# Patient Record
Sex: Male | Born: 1950 | Race: White | Hispanic: No | Marital: Married | Smoking: Never smoker
Health system: Southern US, Community
[De-identification: ages and names within clinical notes are randomized; demographics above are authoritative.]

## PROBLEM LIST (undated history)

## (undated) DIAGNOSIS — M549 Dorsalgia, unspecified: Secondary | ICD-10-CM

## (undated) DIAGNOSIS — E119 Type 2 diabetes mellitus without complications: Secondary | ICD-10-CM

## (undated) DIAGNOSIS — M1990 Unspecified osteoarthritis, unspecified site: Secondary | ICD-10-CM

## (undated) DIAGNOSIS — I10 Essential (primary) hypertension: Secondary | ICD-10-CM

## (undated) DIAGNOSIS — M109 Gout, unspecified: Secondary | ICD-10-CM

## (undated) HISTORY — DX: Gout, unspecified: M10.9

## (undated) HISTORY — DX: Type 2 diabetes mellitus without complications: E11.9

## (undated) HISTORY — DX: Essential (primary) hypertension: I10

## (undated) HISTORY — DX: Dorsalgia, unspecified: M54.9

## (undated) HISTORY — PX: OTHER SURGICAL HISTORY: SHX169

## (undated) HISTORY — DX: Unspecified osteoarthritis, unspecified site: M19.90

---

## 2004-01-24 ENCOUNTER — Emergency Department (HOSPITAL_COMMUNITY): Admission: EM | Admit: 2004-01-24 | Discharge: 2004-01-24 | Payer: Self-pay | Admitting: Emergency Medicine

## 2005-10-04 ENCOUNTER — Ambulatory Visit (HOSPITAL_COMMUNITY): Admission: RE | Admit: 2005-10-04 | Discharge: 2005-10-04 | Payer: Self-pay | Admitting: Pulmonary Disease

## 2007-01-19 ENCOUNTER — Ambulatory Visit: Payer: Self-pay | Admitting: Orthopedic Surgery

## 2007-04-03 ENCOUNTER — Encounter (HOSPITAL_COMMUNITY): Admission: RE | Admit: 2007-04-03 | Discharge: 2007-05-03 | Payer: Self-pay | Admitting: Podiatry

## 2010-08-03 ENCOUNTER — Ambulatory Visit (HOSPITAL_COMMUNITY)
Admission: RE | Admit: 2010-08-03 | Discharge: 2010-08-03 | Payer: Self-pay | Source: Home / Self Care | Admitting: Pulmonary Disease

## 2010-10-05 ENCOUNTER — Ambulatory Visit (HOSPITAL_COMMUNITY)
Admission: RE | Admit: 2010-10-05 | Discharge: 2010-10-05 | Payer: Self-pay | Source: Home / Self Care | Attending: Pulmonary Disease | Admitting: Pulmonary Disease

## 2010-10-21 ENCOUNTER — Other Ambulatory Visit (HOSPITAL_COMMUNITY): Payer: Self-pay | Admitting: Pulmonary Disease

## 2010-10-21 DIAGNOSIS — M549 Dorsalgia, unspecified: Secondary | ICD-10-CM

## 2010-10-23 ENCOUNTER — Ambulatory Visit (HOSPITAL_COMMUNITY)
Admission: RE | Admit: 2010-10-23 | Discharge: 2010-10-23 | Disposition: A | Discharge status: Home / Self Care | Payer: 59 | Source: Ambulatory Visit | Attending: Pulmonary Disease | Admitting: Pulmonary Disease

## 2010-10-23 DIAGNOSIS — M549 Dorsalgia, unspecified: Secondary | ICD-10-CM

## 2017-02-22 DIAGNOSIS — M549 Dorsalgia, unspecified: Secondary | ICD-10-CM | POA: Diagnosis not present

## 2017-02-22 DIAGNOSIS — I10 Essential (primary) hypertension: Secondary | ICD-10-CM | POA: Diagnosis not present

## 2017-02-22 DIAGNOSIS — W57XXXA Bitten or stung by nonvenomous insect and other nonvenomous arthropods, initial encounter: Secondary | ICD-10-CM | POA: Diagnosis not present

## 2017-09-12 DIAGNOSIS — E669 Obesity, unspecified: Secondary | ICD-10-CM | POA: Diagnosis not present

## 2017-09-12 DIAGNOSIS — M549 Dorsalgia, unspecified: Secondary | ICD-10-CM | POA: Diagnosis not present

## 2017-09-12 DIAGNOSIS — I10 Essential (primary) hypertension: Secondary | ICD-10-CM | POA: Diagnosis not present

## 2017-09-12 DIAGNOSIS — E785 Hyperlipidemia, unspecified: Secondary | ICD-10-CM | POA: Diagnosis not present

## 2017-09-12 DIAGNOSIS — M109 Gout, unspecified: Secondary | ICD-10-CM | POA: Diagnosis not present

## 2017-09-12 DIAGNOSIS — R739 Hyperglycemia, unspecified: Secondary | ICD-10-CM | POA: Diagnosis not present

## 2017-09-12 DIAGNOSIS — M545 Low back pain: Secondary | ICD-10-CM | POA: Diagnosis not present

## 2017-09-12 DIAGNOSIS — Z125 Encounter for screening for malignant neoplasm of prostate: Secondary | ICD-10-CM | POA: Diagnosis not present

## 2017-10-03 ENCOUNTER — Ambulatory Visit: Payer: PPO | Admitting: Orthopedic Surgery

## 2017-10-03 ENCOUNTER — Encounter: Payer: Self-pay | Admitting: Orthopedic Surgery

## 2017-10-03 ENCOUNTER — Ambulatory Visit (INDEPENDENT_AMBULATORY_CARE_PROVIDER_SITE_OTHER): Payer: PPO

## 2017-10-03 VITALS — BP 191/106 | HR 75 | Ht 72.0 in | Wt 310.0 lb

## 2017-10-03 DIAGNOSIS — G8929 Other chronic pain: Secondary | ICD-10-CM

## 2017-10-03 DIAGNOSIS — M25561 Pain in right knee: Secondary | ICD-10-CM

## 2017-10-03 NOTE — Patient Instructions (Signed)
Continue your diet modifications  Take diclofenac 75 mg twice a day  Return in 3 weeks

## 2017-10-03 NOTE — Progress Notes (Signed)
NEW PATIENT OFFICE VISIT    Chief Complaint  Patient presents with  . Knee Pain    right     67 year old male comes in for evaluation of painful right knee  He was basically fine until around Christmas time and [facility] by the time he got home he had pain preventing him from ambulating well and he had to get his wife to help him out of the truck.  He called the primary care doctor put him on a short course of steroids and he got 90% better.  However, after 3 days pain came back and so he took another heavier dose of steroids which did give him enough pain relief that he can ambulate however, he now complains of a tightness in the right knee moderate to severe pain when he is not on prednisone the pain is dull constant and now present for about a month to 5 weeks  Denies any trauma    Review of Systems  Constitutional: Negative for chills and fever.  Skin: Negative.   Neurological: Negative for tingling and focal weakness.     Past Medical History:  Diagnosis Date  . Hypertension     Past Surgical History:  Procedure Laterality Date  . none      Family History  Problem Relation Age of Onset  . Healthy Mother   . Healthy Father    Social History   Tobacco Use  . Smoking status: Never Smoker  . Smokeless tobacco: Never Used  Substance Use Topics  . Alcohol use: No    Frequency: Never  . Drug use: No    Current Meds  Medication Sig  . amLODipine (NORVASC) 5 MG tablet   . atenolol (TENORMIN) 50 MG tablet   . diclofenac (VOLTAREN) 75 MG EC tablet   . lisinopril-hydrochlorothiazide (PRINZIDE,ZESTORETIC) 20-12.5 MG tablet     BP (!) 191/106   Pulse 75   Ht 6' (1.829 m)   Wt (!) 310 lb (140.6 kg)   BMI 42.04 kg/m   Physical Exam  Constitutional: He is oriented to person, place, and time. He appears well-developed and well-nourished.  Vital signs have been reviewed and are stable. Gen. appearance the patient is well-developed and well-nourished  with normal grooming and hygiene.   Musculoskeletal:  GAIT IS slightly abnormal  Neurological: He is alert and oriented to person, place, and time.  Skin: Skin is warm and dry. No erythema.  Psychiatric: He has a normal mood and affect.  Vitals reviewed.   Ortho Exam   Right knee:  Small joint effusion medial joint line tenderness knee flexion is limited to 120 degrees with the knee feels tight, ligaments are stable.  No strength deficit skin is intact pulses are good sensation is normal  Left knee:    No tenderness or swelling, range of motion ligaments are stable strength and muscle tone normal skin is intact without erythema no warmth distal pulses are intact sensation is normal    Encounter Diagnosis  Name Primary?  . Chronic pain of right knee Yes   X-ray shows symmetric joint space narrowing the questionable possible chondrocalcinosis in the lateral compartment   PLAN:   Aspirate and inject right knee  Procedure note injection and aspiration right knee joint  Verbal consent was obtained to aspirate and inject the right knee joint   Timeout was completed to confirm the site of aspiration and injection  An 18-gauge needle was used to aspirate the knee joint from  a suprapatellar lateral approach.  The medications used were 40 mg of Depo-Medrol and 1% lidocaine 3 cc  Anesthesia was provided by ethyl chloride and the skin was prepped with alcohol.  After cleaning the skin with alcohol an 18-gauge needle was used to aspirate the right knee joint.  We obtained 0  cc of fluid  We follow this by injection of 40 mg of Depo-Medrol and 3 cc 1% lidocaine.  There were no complications. A sterile bandage was applied.   Increase diclofenac to 75 mg twice a day  3 weeks return for reevaluation

## 2017-10-28 DIAGNOSIS — H524 Presbyopia: Secondary | ICD-10-CM | POA: Diagnosis not present

## 2017-10-31 ENCOUNTER — Ambulatory Visit: Payer: PPO | Admitting: Orthopedic Surgery

## 2017-10-31 VITALS — BP 150/90 | HR 63 | Ht 72.0 in | Wt 290.0 lb

## 2017-10-31 DIAGNOSIS — G8929 Other chronic pain: Secondary | ICD-10-CM

## 2017-10-31 DIAGNOSIS — M25561 Pain in right knee: Secondary | ICD-10-CM

## 2017-10-31 NOTE — Progress Notes (Signed)
Chief Complaint  Patient presents with  . Follow-up    Recheck on right knee    Seen follow-up visit status post aspiration injection right knee and hip increase diclofenac to twice a day patient says he is 95% better  His knee has no effusion full range of motion he has no limp when he walks see him on an as-needed basis

## 2017-12-12 DIAGNOSIS — E669 Obesity, unspecified: Secondary | ICD-10-CM | POA: Diagnosis not present

## 2017-12-12 DIAGNOSIS — E119 Type 2 diabetes mellitus without complications: Secondary | ICD-10-CM | POA: Diagnosis not present

## 2017-12-12 DIAGNOSIS — M549 Dorsalgia, unspecified: Secondary | ICD-10-CM | POA: Diagnosis not present

## 2017-12-12 DIAGNOSIS — I10 Essential (primary) hypertension: Secondary | ICD-10-CM | POA: Diagnosis not present

## 2018-06-28 DIAGNOSIS — Z23 Encounter for immunization: Secondary | ICD-10-CM | POA: Diagnosis not present

## 2018-06-30 DIAGNOSIS — H903 Sensorineural hearing loss, bilateral: Secondary | ICD-10-CM | POA: Diagnosis not present

## 2018-07-05 DIAGNOSIS — H903 Sensorineural hearing loss, bilateral: Secondary | ICD-10-CM | POA: Diagnosis not present

## 2018-07-05 DIAGNOSIS — H9313 Tinnitus, bilateral: Secondary | ICD-10-CM | POA: Diagnosis not present

## 2018-07-05 DIAGNOSIS — J3089 Other allergic rhinitis: Secondary | ICD-10-CM | POA: Diagnosis not present

## 2018-07-14 DIAGNOSIS — H903 Sensorineural hearing loss, bilateral: Secondary | ICD-10-CM | POA: Diagnosis not present

## 2019-02-08 DIAGNOSIS — E119 Type 2 diabetes mellitus without complications: Secondary | ICD-10-CM | POA: Diagnosis not present

## 2019-02-08 DIAGNOSIS — R05 Cough: Secondary | ICD-10-CM | POA: Diagnosis not present

## 2019-02-08 DIAGNOSIS — I10 Essential (primary) hypertension: Secondary | ICD-10-CM | POA: Diagnosis not present

## 2019-03-29 DIAGNOSIS — E669 Obesity, unspecified: Secondary | ICD-10-CM | POA: Diagnosis not present

## 2019-03-29 DIAGNOSIS — I10 Essential (primary) hypertension: Secondary | ICD-10-CM | POA: Diagnosis not present

## 2019-03-29 DIAGNOSIS — M1990 Unspecified osteoarthritis, unspecified site: Secondary | ICD-10-CM | POA: Diagnosis not present

## 2019-04-05 ENCOUNTER — Other Ambulatory Visit: Payer: Self-pay

## 2019-04-06 DIAGNOSIS — E119 Type 2 diabetes mellitus without complications: Secondary | ICD-10-CM | POA: Diagnosis not present

## 2019-04-06 DIAGNOSIS — I10 Essential (primary) hypertension: Secondary | ICD-10-CM | POA: Diagnosis not present

## 2019-04-06 DIAGNOSIS — M1990 Unspecified osteoarthritis, unspecified site: Secondary | ICD-10-CM | POA: Diagnosis not present

## 2019-04-06 DIAGNOSIS — Z125 Encounter for screening for malignant neoplasm of prostate: Secondary | ICD-10-CM | POA: Diagnosis not present

## 2019-04-06 DIAGNOSIS — E669 Obesity, unspecified: Secondary | ICD-10-CM | POA: Diagnosis not present

## 2019-04-06 LAB — BASIC METABOLIC PANEL
BUN: 23 — AB (ref 4–21)
CO2: 23 — AB (ref 13–22)
Chloride: 106 (ref 99–108)
Creatinine: 1.2 (ref <=1.3)
Glucose: 128
Potassium: 4 (ref 3.4–5.3)
Sodium: 141 (ref 137–147)

## 2019-04-06 LAB — LIPID PANEL
Cholesterol: 237 — AB (ref 0–200)
HDL: 41 (ref 35–70)
LDL Cholesterol: 161
Triglycerides: 193 — AB (ref 40–160)

## 2019-04-06 LAB — TSH: TSH: 1.75 (ref <=5.90)

## 2019-04-06 LAB — COMPREHENSIVE METABOLIC PANEL
Albumin: 4.3 (ref 3.5–5.0)
Calcium: 9.4 (ref 8.7–10.7)
GFR calc Af Amer: 75
GFR calc non Af Amer: 65
Globulin: 2.3

## 2019-04-06 LAB — CBC AND DIFFERENTIAL
HCT: 47 (ref 41–53)
Hemoglobin: 15.7 (ref 13.5–17.5)
Neutrophils Absolute: 3562
Platelets: 124 — AB (ref 150–399)
WBC: 6.1

## 2019-04-06 LAB — CBC: RBC: 5.38 — AB (ref 3.87–5.11)

## 2019-04-06 LAB — PSA: PSA: 0.4

## 2019-04-06 LAB — HEMOGLOBIN A1C: Hemoglobin A1C: 6.2

## 2019-04-07 LAB — COLOGUARD: Cologuard: NEGATIVE

## 2019-04-10 DIAGNOSIS — Z1211 Encounter for screening for malignant neoplasm of colon: Secondary | ICD-10-CM | POA: Diagnosis not present

## 2019-04-10 DIAGNOSIS — Z1212 Encounter for screening for malignant neoplasm of rectum: Secondary | ICD-10-CM | POA: Diagnosis not present

## 2019-07-11 DIAGNOSIS — E119 Type 2 diabetes mellitus without complications: Secondary | ICD-10-CM | POA: Diagnosis not present

## 2019-07-11 DIAGNOSIS — Z23 Encounter for immunization: Secondary | ICD-10-CM | POA: Diagnosis not present

## 2019-07-11 DIAGNOSIS — I10 Essential (primary) hypertension: Secondary | ICD-10-CM | POA: Diagnosis not present

## 2019-07-11 DIAGNOSIS — E785 Hyperlipidemia, unspecified: Secondary | ICD-10-CM | POA: Diagnosis not present

## 2019-07-11 DIAGNOSIS — M1990 Unspecified osteoarthritis, unspecified site: Secondary | ICD-10-CM | POA: Diagnosis not present

## 2019-07-28 DIAGNOSIS — E119 Type 2 diabetes mellitus without complications: Secondary | ICD-10-CM | POA: Insufficient documentation

## 2019-07-28 DIAGNOSIS — M1990 Unspecified osteoarthritis, unspecified site: Secondary | ICD-10-CM | POA: Insufficient documentation

## 2019-07-28 DIAGNOSIS — I10 Essential (primary) hypertension: Secondary | ICD-10-CM | POA: Insufficient documentation

## 2019-07-28 DIAGNOSIS — M549 Dorsalgia, unspecified: Secondary | ICD-10-CM | POA: Insufficient documentation

## 2019-07-28 DIAGNOSIS — M109 Gout, unspecified: Secondary | ICD-10-CM

## 2019-09-24 ENCOUNTER — Other Ambulatory Visit: Payer: Self-pay

## 2019-09-24 ENCOUNTER — Ambulatory Visit (INDEPENDENT_AMBULATORY_CARE_PROVIDER_SITE_OTHER): Payer: PPO | Admitting: Family Medicine

## 2019-09-24 ENCOUNTER — Encounter: Payer: Self-pay | Admitting: Family Medicine

## 2019-09-24 VITALS — BP 130/99 | HR 62 | Temp 98.6°F | Ht 71.0 in | Wt 300.6 lb

## 2019-09-24 DIAGNOSIS — E119 Type 2 diabetes mellitus without complications: Secondary | ICD-10-CM

## 2019-09-24 DIAGNOSIS — M109 Gout, unspecified: Secondary | ICD-10-CM

## 2019-09-24 DIAGNOSIS — D696 Thrombocytopenia, unspecified: Secondary | ICD-10-CM

## 2019-09-24 DIAGNOSIS — E785 Hyperlipidemia, unspecified: Secondary | ICD-10-CM

## 2019-09-24 DIAGNOSIS — I10 Essential (primary) hypertension: Secondary | ICD-10-CM | POA: Diagnosis not present

## 2019-09-24 NOTE — Patient Instructions (Addendum)
Pt to make diabetic eye exam Fasting labwork after change in cholesterol medication  Diabetes Mellitus and Nutrition, Adult When you have diabetes (diabetes mellitus), it is very important to have healthy eating habits because your blood sugar (glucose) levels are greatly affected by what you eat and drink. Eating healthy foods in the appropriate amounts, at about the same times every day, can help you:  Control your blood glucose.  Lower your risk of heart disease.  Improve your blood pressure.  Reach or maintain a healthy weight. Every person with diabetes is different, and each person has different needs for a meal plan. Your health care provider may recommend that you work with a diet and nutrition specialist (dietitian) to make a meal plan that is best for you. Your meal plan may vary depending on factors such as:  The calories you need.  The medicines you take.  Your weight.  Your blood glucose, blood pressure, and cholesterol levels.  Your activity level.  Other health conditions you have, such as heart or kidney disease. How do carbohydrates affect me? Carbohydrates, also called carbs, affect your blood glucose level more than any other type of food. Eating carbs naturally raises the amount of glucose in your blood. Carb counting is a method for keeping track of how many carbs you eat. Counting carbs is important to keep your blood glucose at a healthy level, especially if you use insulin or take certain oral diabetes medicines. It is important to know how many carbs you can safely have in each meal. This is different for every person. Your dietitian can help you calculate how many carbs you should have at each meal and for each snack. Foods that contain carbs include:  Bread, cereal, rice, pasta, and crackers.  Potatoes and corn.  Peas, beans, and lentils.  Milk and yogurt.  Fruit and juice.  Desserts, such as cakes, cookies, ice cream, and candy. How does alcohol  affect me? Alcohol can cause a sudden decrease in blood glucose (hypoglycemia), especially if you use insulin or take certain oral diabetes medicines. Hypoglycemia can be a life-threatening condition. Symptoms of hypoglycemia (sleepiness, dizziness, and confusion) are similar to symptoms of having too much alcohol. If your health care provider says that alcohol is safe for you, follow these guidelines:  Limit alcohol intake to no more than 1 drink per day for nonpregnant women and 2 drinks per day for men. One drink equals 12 oz of beer, 5 oz of wine, or 1 oz of hard liquor.  Do not drink on an empty stomach.  Keep yourself hydrated with water, diet soda, or unsweetened iced tea.  Keep in mind that regular soda, juice, and other mixers may contain a lot of sugar and must be counted as carbs. What are tips for following this plan?  Reading food labels  Start by checking the serving size on the "Nutrition Facts" label of packaged foods and drinks. The amount of calories, carbs, fats, and other nutrients listed on the label is based on one serving of the item. Many items contain more than one serving per package.  Check the total grams (g) of carbs in one serving. You can calculate the number of servings of carbs in one serving by dividing the total carbs by 15. For example, if a food has 30 g of total carbs, it would be equal to 2 servings of carbs.  Check the number of grams (g) of saturated and trans fats in one serving. Choose foods  that have low or no amount of these fats.  Check the number of milligrams (mg) of salt (sodium) in one serving. Most people should limit total sodium intake to less than 2,300 mg per day.  Always check the nutrition information of foods labeled as "low-fat" or "nonfat". These foods may be higher in added sugar or refined carbs and should be avoided.  Talk to your dietitian to identify your daily goals for nutrients listed on the label. Shopping  Avoid buying  canned, premade, or processed foods. These foods tend to be high in fat, sodium, and added sugar.  Shop around the outside edge of the grocery store. This includes fresh fruits and vegetables, bulk grains, fresh meats, and fresh dairy. Cooking  Use low-heat cooking methods, such as baking, instead of high-heat cooking methods like deep frying.  Cook using healthy oils, such as olive, canola, or sunflower oil.  Avoid cooking with butter, cream, or high-fat meats. Meal planning  Eat meals and snacks regularly, preferably at the same times every day. Avoid going long periods of time without eating.  Eat foods high in fiber, such as fresh fruits, vegetables, beans, and whole grains. Talk to your dietitian about how many servings of carbs you can eat at each meal.  Eat 4-6 ounces (oz) of lean protein each day, such as lean meat, chicken, fish, eggs, or tofu. One oz of lean protein is equal to: ? 1 oz of meat, chicken, or fish. ? 1 egg. ?  cup of tofu.  Eat some foods each day that contain healthy fats, such as avocado, nuts, seeds, and fish. Lifestyle  Check your blood glucose regularly.  Exercise regularly as told by your health care provider. This may include: ? 150 minutes of moderate-intensity or vigorous-intensity exercise each week. This could be brisk walking, biking, or water aerobics. ? Stretching and doing strength exercises, such as yoga or weightlifting, at least 2 times a week.  Take medicines as told by your health care provider.  Do not use any products that contain nicotine or tobacco, such as cigarettes and e-cigarettes. If you need help quitting, ask your health care provider.  Work with a Veterinary surgeon or diabetes educator to identify strategies to manage stress and any emotional and social challenges. Questions to ask a health care provider  Do I need to meet with a diabetes educator?  Do I need to meet with a dietitian?  What number can I call if I have  questions?  When are the best times to check my blood glucose? Where to find more information:  American Diabetes Association: diabetes.org  Academy of Nutrition and Dietetics: www.eatright.[employer] of Diabetes and Digestive and Kidney Diseases (NIH): CarFlippers.tn Summary  A healthy meal plan will help you control your blood glucose and maintain a healthy lifestyle.  Working with a diet and nutrition specialist (dietitian) can help you make a meal plan that is best for you.  Keep in mind that carbohydrates (carbs) and alcohol have immediate effects on your blood glucose levels. It is important to count carbs and to use alcohol carefully. This information is not intended to replace advice given to you by your health care provider. Make sure you discuss any questions you have with your health care provider. Document Revised: 08/05/2017 Document Reviewed: 09/27/2016 Elsevier Patient Education  2020 Elsevier Inc.  Diabetes Mellitus and Foot Care Foot care is an important part of your health, especially when you have diabetes. Diabetes may cause you to  have problems because of poor blood flow (circulation) to your feet and legs, which can cause your skin to:  Become thinner and drier.  Break more easily.  Heal more slowly.  Peel and crack. You may also have nerve damage (neuropathy) in your legs and feet, causing decreased feeling in them. This means that you may not notice minor injuries to your feet that could lead to more serious problems. Noticing and addressing any potential problems early is the best way to prevent future foot problems. How to care for your feet Foot hygiene  Wash your feet daily with warm water and mild soap. Do not use hot water. Then, pat your feet and the areas between your toes until they are completely dry. Do not soak your feet as this can dry your skin.  Trim your toenails straight across. Do not dig under them or around the cuticle.  File the edges of your nails with an emery board or nail file.  Apply a moisturizing lotion or petroleum jelly to the skin on your feet and to dry, brittle toenails. Use lotion that does not contain alcohol and is unscented. Do not apply lotion between your toes. Shoes and socks  Wear clean socks or stockings every day. Make sure they are not too tight. Do not wear knee-high stockings since they may decrease blood flow to your legs.  Wear shoes that fit properly and have enough cushioning. Always look in your shoes before you put them on to be sure there are no objects inside.  To break in new shoes, wear them for just a few hours a day. This prevents injuries on your feet. Wounds, scrapes, corns, and calluses  Check your feet daily for blisters, cuts, bruises, sores, and redness. If you cannot see the bottom of your feet, use a mirror or ask someone for help.  Do not cut corns or calluses or try to remove them with medicine.  If you find a minor scrape, cut, or break in the skin on your feet, keep it and the skin around it clean and dry. You may clean these areas with mild soap and water. Do not clean the area with peroxide, alcohol, or iodine.  If you have a wound, scrape, corn, or callus on your foot, look at it several times a day to make sure it is healing and not infected. Check for: ? Redness, swelling, or pain. ? Fluid or blood. ? Warmth. ? Pus or a bad smell. General instructions  Do not cross your legs. This may decrease blood flow to your feet.  Do not use heating pads or hot water bottles on your feet. They may burn your skin. If you have lost feeling in your feet or legs, you may not know this is happening until it is too late.  Protect your feet from hot and cold by wearing shoes, such as at the beach or on hot pavement.  Schedule a complete foot exam at least once a year (annually) or more often if you have foot problems. If you have foot problems, report any cuts,  sores, or bruises to your health care provider immediately. Contact a health care provider if:  You have a medical condition that increases your risk of infection and you have any cuts, sores, or bruises on your feet.  You have an injury that is not healing.  You have redness on your legs or feet.  You feel burning or tingling in your legs or feet.  You  have pain or cramps in your legs and feet.  Your legs or feet are numb.  Your feet always feel cold.  You have pain around a toenail. Get help right away if:  You have a wound, scrape, corn, or callus on your foot and: ? You have pain, swelling, or redness that gets worse. ? You have fluid or blood coming from the wound, scrape, corn, or callus. ? Your wound, scrape, corn, or callus feels warm to the touch. ? You have pus or a bad smell coming from the wound, scrape, corn, or callus. ? You have a fever. ? You have a red line going up your leg. Summary  Check your feet every day for cuts, sores, red spots, swelling, and blisters.  Moisturize feet and legs daily.  Wear shoes that fit properly and have enough cushioning.  If you have foot problems, report any cuts, sores, or bruises to your health care provider immediately.  Schedule a complete foot exam at least once a year (annually) or more often if you have foot problems. This information is not intended to replace advice given to you by your health care provider. Make sure you discuss any questions you have with your health care provider. Document Revised: 05/16/2019 Document Reviewed: 09/24/2016 Elsevier Patient Education  2020 [employer].

## 2019-09-24 NOTE — Progress Notes (Signed)
New Patient Office Visit  Subjective:  Patient ID: Phillip Hayes, male    DOB: 1951-04-28  Age: 69 y.o. MRN: [id number]  CC:  Chief Complaint  Patient presents with  . Establish Care  HTN/Hyperlipidemia  HPI WIL SLAPE presents for DM-type2 Hyperlipidemia-Atenolol HTN-Atenolol/Amlodipine/Olmesartan/HCTZ DM-type 2 Gout-no recent flare-took prednisone in the past Past Medical History:  Diagnosis Date  . Dorsalgia, unspecified   . Essential (primary) hypertension   . Gout, unspecified   . Hypertension   . Type 2 diabetes mellitus without complications (HCC)   . Unspecified osteoarthritis, unspecified site     Past Surgical History:  Procedure Laterality Date  . none      Family History  Problem Relation Age of Onset  . Healthy Mother   . Heart disease Mother   . Stroke Mother   . Healthy Father     Social History   Socioeconomic History  . Marital status: Married    Spouse name: Not on file  . Number of children: Not on file  . Years of education: Not on file  . Highest education level: Not on file  Occupational History  . Not on file  Tobacco Use  . Smoking status: Never Smoker  . Smokeless tobacco: Never Used  Substance and Sexual Activity  . Alcohol use: No  . Drug use: No  . Sexual activity: Not on file  Other Topics Concern  . Not on file  Social History Narrative  . Not on file   Social Determinants of Health   Financial Resource Strain:   . Difficulty of Paying Living Expenses: Not on file  Food Insecurity:   . Worried About Programme researcher, broadcasting/film/video in the Last Year: Not on file  . Ran Out of Food in the Last Year: Not on file  Transportation Needs:   . Lack of Transportation (Medical): Not on file  . Lack of Transportation (Non-Medical): Not on file  Physical Activity:   . Days of Exercise per Week: Not on file  . Minutes of Exercise per Session: Not on file  Stress:   . Feeling of Stress : Not on file  Social Connections:   .  Frequency of Communication with Friends and Family: Not on file  . Frequency of Social Gatherings with Friends and Family: Not on file  . Attends Religious Services: Not on file  . Active Member of Clubs or Organizations: Not on file  . Attends Banker Meetings: Not on file  . Marital Status: Not on file  Intimate Partner Violence:   . Fear of Current or Ex-Partner: Not on file  . Emotionally Abused: Not on file  . Physically Abused: Not on file  . Sexually Abused: Not on file    ROS Review of Systems  Constitutional: Negative.   HENT: Positive for tinnitus.   Eyes: Negative.   Respiratory: Negative.   Cardiovascular: Negative.   Gastrointestinal: Negative.   Endocrine: Negative.   Genitourinary: Negative.   Musculoskeletal: Negative.   Allergic/Immunologic: Negative.   Neurological: Negative.   Hematological: Negative.   Psychiatric/Behavioral: Negative.     Objective:   Today's Vitals: BP (!) 130/99 (BP Location: Left Arm, Patient Position: Sitting, Cuff Size: Normal)   Pulse 62   Temp 98.6 F (37 C) (Temporal)   Ht 5\' 11"  (1.803 m)   Wt (!) 300 lb 9.6 oz (136.4 kg)   SpO2 99%   BMI 41.93 kg/m   Physical Exam Vitals reviewed.  Constitutional:  Appearance: Normal appearance.  HENT:     Head: Normocephalic and atraumatic.  Cardiovascular:     Rate and Rhythm: Normal rate and regular rhythm.     Pulses: Normal pulses.     Heart sounds: Normal heart sounds.  Pulmonary:     Effort: Pulmonary effort is normal.     Breath sounds: Normal breath sounds.  Musculoskeletal:     Cervical back: Normal range of motion.  Neurological:     Mental Status: He is alert and oriented to person, place, and time.     Assessment & Plan:  1. Type 2 diabetes mellitus without complication, unspecified whether long term insulin use (HCC) No medication-diet control-A1c Eye exam-pt to schedule eye exam Foot exam completed-d/w pt 2. Essential (primary)  hypertension Amlodipine/Atenolol/Olesartan/HCTZ-cmp, u/a-to establish stablity  3. Gout, unspecified cause, unspecified chronicity, unspecified site No recent flares  4. Hyperlipidemia, unspecified hyperlipidemia type Rosuvastatin-lipid panel to establish stablilty after starting medication 3 x per week Pt with concern for   5. Thrombocytopenia ([place]) Continue to follow-no current concerns with bleeding-cbc  Outpatient Encounter Medications as of 09/24/2019  Medication Sig  . amLODipine (NORVASC) 10 MG tablet Take 10 mg by mouth daily.  Marland Kitchen olmesartan-hydrochlorothiazide (BENICAR HCT) 40-12.5 MG tablet Take 1 tablet by mouth daily.  . predniSONE (DELTASONE) 10 MG tablet Take 10 mg by mouth daily with breakfast. *prn  . rosuvastatin (CRESTOR) 20 MG tablet Take 20 mg by mouth daily.  Marland Kitchen atenolol (TENORMIN) 50 MG tablet   . diclofenac (VOLTAREN) 75 MG EC tablet   . [DISCONTINUED] amLODipine (NORVASC) 5 MG tablet 10 mg.   . [DISCONTINUED] lisinopril-hydrochlorothiazide (PRINZIDE,ZESTORETIC) 20-12.5 MG tablet    No facility-administered encounter medications on file as of 09/24/2019.    Follow-up: fasting labwork  Ahlijah Raia Hannah Beat, MD

## 2019-10-08 ENCOUNTER — Ambulatory Visit: Payer: PPO | Admitting: Family Medicine

## 2019-11-26 ENCOUNTER — Other Ambulatory Visit: Payer: Self-pay | Admitting: Emergency Medicine

## 2019-11-26 DIAGNOSIS — M109 Gout, unspecified: Secondary | ICD-10-CM

## 2019-11-26 MED ORDER — DICLOFENAC SODIUM 75 MG PO TBEC: 75.0000 mg | DELAYED_RELEASE_TABLET | Freq: Two times a day (BID) | ORAL | 1 refills | Status: AC

## 2019-12-18 ENCOUNTER — Other Ambulatory Visit: Payer: Self-pay | Admitting: Emergency Medicine

## 2019-12-18 DIAGNOSIS — I10 Essential (primary) hypertension: Secondary | ICD-10-CM

## 2019-12-18 MED ORDER — AMLODIPINE BESYLATE 10 MG PO TABS: 10.0000 mg | ORAL_TABLET | Freq: Every day | ORAL | 0 refills | Status: AC

## 2019-12-24 DIAGNOSIS — Z0189 Encounter for other specified special examinations: Secondary | ICD-10-CM | POA: Diagnosis not present

## 2019-12-24 DIAGNOSIS — I10 Essential (primary) hypertension: Secondary | ICD-10-CM | POA: Diagnosis not present

## 2019-12-24 DIAGNOSIS — E785 Hyperlipidemia, unspecified: Secondary | ICD-10-CM | POA: Diagnosis not present

## 2019-12-24 DIAGNOSIS — M1711 Unilateral primary osteoarthritis, right knee: Secondary | ICD-10-CM | POA: Diagnosis not present

## 2020-01-23 DIAGNOSIS — E785 Hyperlipidemia, unspecified: Secondary | ICD-10-CM | POA: Diagnosis not present

## 2020-01-23 DIAGNOSIS — Z0000 Encounter for general adult medical examination without abnormal findings: Secondary | ICD-10-CM | POA: Diagnosis not present

## 2020-01-23 DIAGNOSIS — I10 Essential (primary) hypertension: Secondary | ICD-10-CM | POA: Diagnosis not present

## 2020-01-30 DIAGNOSIS — I10 Essential (primary) hypertension: Secondary | ICD-10-CM | POA: Diagnosis not present

## 2020-01-30 DIAGNOSIS — M1711 Unilateral primary osteoarthritis, right knee: Secondary | ICD-10-CM | POA: Diagnosis not present

## 2020-01-30 DIAGNOSIS — E785 Hyperlipidemia, unspecified: Secondary | ICD-10-CM | POA: Diagnosis not present

## 2020-01-30 DIAGNOSIS — R7301 Impaired fasting glucose: Secondary | ICD-10-CM | POA: Diagnosis not present

## 2020-01-30 DIAGNOSIS — D696 Thrombocytopenia, unspecified: Secondary | ICD-10-CM | POA: Diagnosis not present

## 2020-03-26 ENCOUNTER — Ambulatory Visit: Payer: PPO | Admitting: Family Medicine

## 2020-04-30 ENCOUNTER — Other Ambulatory Visit: Payer: Self-pay | Admitting: Family Medicine

## 2020-04-30 DIAGNOSIS — I10 Essential (primary) hypertension: Secondary | ICD-10-CM

## 2020-06-04 DIAGNOSIS — R7301 Impaired fasting glucose: Secondary | ICD-10-CM | POA: Diagnosis not present

## 2020-06-04 DIAGNOSIS — E785 Hyperlipidemia, unspecified: Secondary | ICD-10-CM | POA: Diagnosis not present

## 2020-06-04 DIAGNOSIS — I10 Essential (primary) hypertension: Secondary | ICD-10-CM | POA: Diagnosis not present

## 2020-06-11 DIAGNOSIS — Z0001 Encounter for general adult medical examination with abnormal findings: Secondary | ICD-10-CM | POA: Diagnosis not present

## 2020-06-11 DIAGNOSIS — E1165 Type 2 diabetes mellitus with hyperglycemia: Secondary | ICD-10-CM | POA: Diagnosis not present

## 2020-06-11 DIAGNOSIS — R040 Epistaxis: Secondary | ICD-10-CM | POA: Diagnosis not present

## 2020-06-11 DIAGNOSIS — M1711 Unilateral primary osteoarthritis, right knee: Secondary | ICD-10-CM | POA: Diagnosis not present

## 2020-06-11 DIAGNOSIS — D696 Thrombocytopenia, unspecified: Secondary | ICD-10-CM | POA: Diagnosis not present

## 2020-06-11 DIAGNOSIS — E785 Hyperlipidemia, unspecified: Secondary | ICD-10-CM | POA: Diagnosis not present

## 2020-06-11 DIAGNOSIS — R7301 Impaired fasting glucose: Secondary | ICD-10-CM | POA: Diagnosis not present

## 2020-06-11 DIAGNOSIS — I10 Essential (primary) hypertension: Secondary | ICD-10-CM | POA: Diagnosis not present

## 2020-07-03 DIAGNOSIS — S80821A Blister (nonthermal), right lower leg, initial encounter: Secondary | ICD-10-CM | POA: Diagnosis not present

## 2020-12-08 DIAGNOSIS — S80821A Blister (nonthermal), right lower leg, initial encounter: Secondary | ICD-10-CM | POA: Diagnosis not present

## 2020-12-08 DIAGNOSIS — Z0001 Encounter for general adult medical examination with abnormal findings: Secondary | ICD-10-CM | POA: Diagnosis not present

## 2020-12-08 DIAGNOSIS — R040 Epistaxis: Secondary | ICD-10-CM | POA: Diagnosis not present

## 2020-12-08 DIAGNOSIS — D696 Thrombocytopenia, unspecified: Secondary | ICD-10-CM | POA: Diagnosis not present

## 2020-12-08 DIAGNOSIS — E785 Hyperlipidemia, unspecified: Secondary | ICD-10-CM | POA: Diagnosis not present

## 2020-12-08 DIAGNOSIS — M1711 Unilateral primary osteoarthritis, right knee: Secondary | ICD-10-CM | POA: Diagnosis not present

## 2020-12-08 DIAGNOSIS — R7301 Impaired fasting glucose: Secondary | ICD-10-CM | POA: Diagnosis not present

## 2020-12-08 DIAGNOSIS — E1165 Type 2 diabetes mellitus with hyperglycemia: Secondary | ICD-10-CM | POA: Diagnosis not present

## 2020-12-08 DIAGNOSIS — I10 Essential (primary) hypertension: Secondary | ICD-10-CM | POA: Diagnosis not present

## 2020-12-08 DIAGNOSIS — Z712 Person consulting for explanation of examination or test findings: Secondary | ICD-10-CM | POA: Diagnosis not present

## 2020-12-15 DIAGNOSIS — M1711 Unilateral primary osteoarthritis, right knee: Secondary | ICD-10-CM | POA: Diagnosis not present

## 2020-12-15 DIAGNOSIS — R809 Proteinuria, unspecified: Secondary | ICD-10-CM | POA: Diagnosis not present

## 2020-12-15 DIAGNOSIS — Z6841 Body Mass Index (BMI) 40.0 and over, adult: Secondary | ICD-10-CM | POA: Diagnosis not present

## 2020-12-15 DIAGNOSIS — E1165 Type 2 diabetes mellitus with hyperglycemia: Secondary | ICD-10-CM | POA: Diagnosis not present

## 2020-12-15 DIAGNOSIS — E785 Hyperlipidemia, unspecified: Secondary | ICD-10-CM | POA: Diagnosis not present

## 2020-12-15 DIAGNOSIS — D696 Thrombocytopenia, unspecified: Secondary | ICD-10-CM | POA: Diagnosis not present

## 2020-12-15 DIAGNOSIS — I10 Essential (primary) hypertension: Secondary | ICD-10-CM | POA: Diagnosis not present

## 2021-03-16 DIAGNOSIS — E785 Hyperlipidemia, unspecified: Secondary | ICD-10-CM | POA: Diagnosis not present

## 2021-03-16 DIAGNOSIS — I10 Essential (primary) hypertension: Secondary | ICD-10-CM | POA: Diagnosis not present

## 2021-03-16 DIAGNOSIS — R7301 Impaired fasting glucose: Secondary | ICD-10-CM | POA: Diagnosis not present

## 2021-03-23 DIAGNOSIS — I10 Essential (primary) hypertension: Secondary | ICD-10-CM | POA: Diagnosis not present

## 2021-03-23 DIAGNOSIS — D696 Thrombocytopenia, unspecified: Secondary | ICD-10-CM | POA: Diagnosis not present

## 2021-03-23 DIAGNOSIS — M1711 Unilateral primary osteoarthritis, right knee: Secondary | ICD-10-CM | POA: Diagnosis not present

## 2021-03-23 DIAGNOSIS — R809 Proteinuria, unspecified: Secondary | ICD-10-CM | POA: Diagnosis not present

## 2021-03-23 DIAGNOSIS — E782 Mixed hyperlipidemia: Secondary | ICD-10-CM | POA: Diagnosis not present

## 2021-03-23 DIAGNOSIS — E1165 Type 2 diabetes mellitus with hyperglycemia: Secondary | ICD-10-CM | POA: Diagnosis not present

## 2021-04-10 ENCOUNTER — Emergency Department (HOSPITAL_COMMUNITY): Payer: PPO

## 2021-04-10 ENCOUNTER — Inpatient Hospital Stay (HOSPITAL_COMMUNITY)
Admission: EM | Admit: 2021-04-10 | Discharge: 2021-04-12 | DRG: 200 | Disposition: A | Discharge status: Home / Self Care | Payer: PPO | Attending: Surgery | Admitting: Surgery

## 2021-04-10 DIAGNOSIS — I10 Essential (primary) hypertension: Secondary | ICD-10-CM | POA: Diagnosis present

## 2021-04-10 DIAGNOSIS — Y92009 Unspecified place in unspecified non-institutional (private) residence as the place of occurrence of the external cause: Secondary | ICD-10-CM

## 2021-04-10 DIAGNOSIS — Z7952 Long term (current) use of systemic steroids: Secondary | ICD-10-CM | POA: Diagnosis not present

## 2021-04-10 DIAGNOSIS — Z4682 Encounter for fitting and adjustment of non-vascular catheter: Secondary | ICD-10-CM

## 2021-04-10 DIAGNOSIS — E119 Type 2 diabetes mellitus without complications: Secondary | ICD-10-CM | POA: Diagnosis present

## 2021-04-10 DIAGNOSIS — Z79899 Other long term (current) drug therapy: Secondary | ICD-10-CM | POA: Diagnosis not present

## 2021-04-10 DIAGNOSIS — S2232XA Fracture of one rib, left side, initial encounter for closed fracture: Secondary | ICD-10-CM | POA: Diagnosis present

## 2021-04-10 DIAGNOSIS — J939 Pneumothorax, unspecified: Secondary | ICD-10-CM | POA: Diagnosis present

## 2021-04-10 DIAGNOSIS — S3991XA Unspecified injury of abdomen, initial encounter: Secondary | ICD-10-CM | POA: Diagnosis not present

## 2021-04-10 DIAGNOSIS — Z8249 Family history of ischemic heart disease and other diseases of the circulatory system: Secondary | ICD-10-CM

## 2021-04-10 DIAGNOSIS — M109 Gout, unspecified: Secondary | ICD-10-CM | POA: Diagnosis not present

## 2021-04-10 DIAGNOSIS — Z20822 Contact with and (suspected) exposure to covid-19: Secondary | ICD-10-CM | POA: Diagnosis present

## 2021-04-10 DIAGNOSIS — W11XXXA Fall on and from ladder, initial encounter: Secondary | ICD-10-CM | POA: Diagnosis present

## 2021-04-10 DIAGNOSIS — S270XXA Traumatic pneumothorax, initial encounter: Secondary | ICD-10-CM | POA: Diagnosis not present

## 2021-04-10 DIAGNOSIS — J9690 Respiratory failure, unspecified, unspecified whether with hypoxia or hypercapnia: Secondary | ICD-10-CM

## 2021-04-10 DIAGNOSIS — R0781 Pleurodynia: Secondary | ICD-10-CM | POA: Diagnosis present

## 2021-04-10 DIAGNOSIS — R0602 Shortness of breath: Secondary | ICD-10-CM | POA: Diagnosis not present

## 2021-04-10 DIAGNOSIS — J9811 Atelectasis: Secondary | ICD-10-CM | POA: Diagnosis not present

## 2021-04-10 DIAGNOSIS — S199XXA Unspecified injury of neck, initial encounter: Secondary | ICD-10-CM | POA: Diagnosis not present

## 2021-04-10 DIAGNOSIS — I517 Cardiomegaly: Secondary | ICD-10-CM | POA: Diagnosis not present

## 2021-04-10 DIAGNOSIS — Z043 Encounter for examination and observation following other accident: Secondary | ICD-10-CM | POA: Diagnosis not present

## 2021-04-10 LAB — COMPREHENSIVE METABOLIC PANEL
ALT: 19 U/L (ref 0–44)
AST: 22 U/L (ref 15–41)
Albumin: 4.5 g/dL (ref 3.5–5.0)
Alkaline Phosphatase: 67 U/L (ref 38–126)
Anion gap: 10 (ref 5–15)
BUN: 44 mg/dL — ABNORMAL HIGH (ref 8–23)
CO2: 25 mmol/L (ref 22–32)
Calcium: 9.5 mg/dL (ref 8.9–10.3)
Chloride: 104 mmol/L (ref 98–111)
Creatinine, Ser: 1.46 mg/dL — ABNORMAL HIGH (ref 0.61–1.24)
GFR, Estimated: 52 mL/min — ABNORMAL LOW (ref >60)
Glucose, Bld: 166 mg/dL — ABNORMAL HIGH (ref 70–99)
Potassium: 3.6 mmol/L (ref 3.5–5.1)
Sodium: 139 mmol/L (ref 135–145)
Total Bilirubin: 1.3 mg/dL — ABNORMAL HIGH (ref 0.3–1.2)
Total Protein: 7.7 g/dL (ref 6.5–8.1)

## 2021-04-10 LAB — CBC WITH DIFFERENTIAL/PLATELET
Abs Immature Granulocytes: 0.08 10*3/uL — ABNORMAL HIGH (ref 0.00–0.07)
Basophils Absolute: 0.1 10*3/uL (ref 0.0–0.1)
Basophils Relative: 1 %
Eosinophils Absolute: 0.2 10*3/uL (ref 0.0–0.5)
Eosinophils Relative: 2 %
HCT: 49.7 % (ref 39.0–52.0)
Hemoglobin: 16.2 g/dL (ref 13.0–17.0)
Immature Granulocytes: 1 %
Lymphocytes Relative: 11 %
Lymphs Abs: 1.3 10*3/uL (ref 0.7–4.0)
MCH: 30.5 pg (ref 26.0–34.0)
MCHC: 32.6 g/dL (ref 30.0–36.0)
MCV: 93.4 fL (ref 80.0–100.0)
Monocytes Absolute: 0.5 10*3/uL (ref 0.1–1.0)
Monocytes Relative: 4 %
Neutro Abs: 10.2 10*3/uL — ABNORMAL HIGH (ref 1.7–7.7)
Neutrophils Relative %: 81 %
Platelets: 141 10*3/uL — ABNORMAL LOW (ref 150–400)
RBC: 5.32 MIL/uL (ref 4.22–5.81)
RDW: 13.2 % (ref 11.5–15.5)
WBC: 12.4 10*3/uL — ABNORMAL HIGH (ref 4.0–10.5)
nRBC: 0 % (ref 0.0–0.2)

## 2021-04-10 LAB — SAMPLE TO BLOOD BANK

## 2021-04-10 LAB — RESP PANEL BY RT-PCR (FLU A&B, COVID) ARPGX2
Influenza A by PCR: NEGATIVE
Influenza B by PCR: NEGATIVE
SARS Coronavirus 2 by RT PCR: NEGATIVE

## 2021-04-10 MED ORDER — IOHEXOL 350 MG/ML SOLN
80.0000 mL | Freq: Once | INTRAVENOUS | Status: AC | PRN
  Administered 2021-04-10: 85 mL via INTRAVENOUS

## 2021-04-10 MED ORDER — MORPHINE SULFATE (PF) 2 MG/ML IV SOLN: 2.0000 mg | INTRAVENOUS | Status: DC | PRN

## 2021-04-10 MED ORDER — ONDANSETRON HCL 4 MG/2ML IJ SOLN
4.0000 mg | Freq: Once | INTRAMUSCULAR | Status: AC
  Administered 2021-04-10: 4 mg via INTRAVENOUS
  Filled 2021-04-10: qty 2

## 2021-04-10 MED ORDER — LIDOCAINE 5 % EX PTCH
1.0000 | MEDICATED_PATCH | CUTANEOUS | Status: DC
  Administered 2021-04-11 – 2021-04-12 (×2): 1 via TRANSDERMAL
  Filled 2021-04-10 (×2): qty 1

## 2021-04-10 MED ORDER — LIDOCAINE 5 % EX PTCH: 1.0000 | MEDICATED_PATCH | CUTANEOUS | Status: DC

## 2021-04-10 MED ORDER — LIDOCAINE-EPINEPHRINE (PF) 2 %-1:200000 IJ SOLN
20.0000 mL | Freq: Once | INTRAMUSCULAR | Status: DC
Start: 2021-04-10 — End: 2021-04-12
  Filled 2021-04-10: qty 20

## 2021-04-10 MED ORDER — METHOCARBAMOL 500 MG PO TABS
1000.0000 mg | ORAL_TABLET | Freq: Three times a day (TID) | ORAL | Status: DC
  Administered 2021-04-10 – 2021-04-12 (×5): 1000 mg via ORAL
  Filled 2021-04-10 (×5): qty 2

## 2021-04-10 MED ORDER — OXYCODONE HCL 5 MG/5ML PO SOLN
5.0000 mg | ORAL | Status: DC | PRN
  Administered 2021-04-10 – 2021-04-11 (×2): 10 mg via ORAL
  Filled 2021-04-10 (×2): qty 10

## 2021-04-10 MED ORDER — ONDANSETRON 4 MG PO TBDP: 4.0000 mg | ORAL_TABLET | Freq: Four times a day (QID) | ORAL | Status: DC | PRN

## 2021-04-10 MED ORDER — LACTATED RINGERS IV SOLN: INTRAVENOUS | Status: DC

## 2021-04-10 MED ORDER — MORPHINE SULFATE (PF) 4 MG/ML IV SOLN
4.0000 mg | Freq: Once | INTRAVENOUS | Status: AC
  Administered 2021-04-10: 4 mg via INTRAVENOUS
  Filled 2021-04-10: qty 1

## 2021-04-10 MED ORDER — LIDOCAINE 5 % EX PTCH
1.0000 | MEDICATED_PATCH | Freq: Once | CUTANEOUS | Status: AC
  Administered 2021-04-11: 1 via TRANSDERMAL
  Filled 2021-04-10: qty 1

## 2021-04-10 MED ORDER — KETOROLAC TROMETHAMINE 30 MG/ML IJ SOLN
15.0000 mg | Freq: Once | INTRAMUSCULAR | Status: AC
  Administered 2021-04-10: 15 mg via INTRAVENOUS
  Filled 2021-04-10: qty 1

## 2021-04-10 MED ORDER — ONDANSETRON HCL 4 MG/2ML IJ SOLN: 4.0000 mg | Freq: Four times a day (QID) | INTRAMUSCULAR | Status: DC | PRN

## 2021-04-10 MED ORDER — ACETAMINOPHEN 500 MG PO TABS
1000.0000 mg | ORAL_TABLET | Freq: Four times a day (QID) | ORAL | Status: DC
  Administered 2021-04-11 – 2021-04-12 (×6): 1000 mg via ORAL
  Filled 2021-04-10 (×7): qty 2

## 2021-04-10 MED ORDER — ENOXAPARIN SODIUM 30 MG/0.3ML IJ SOSY
30.0000 mg | PREFILLED_SYRINGE | Freq: Two times a day (BID) | INTRAMUSCULAR | Status: DC
  Administered 2021-04-11 – 2021-04-12 (×3): 30 mg via SUBCUTANEOUS
  Filled 2021-04-10 (×3): qty 0.3

## 2021-04-10 MED ORDER — DOCUSATE SODIUM 100 MG PO CAPS
100.0000 mg | ORAL_CAPSULE | Freq: Two times a day (BID) | ORAL | Status: DC
  Administered 2021-04-11 – 2021-04-12 (×3): 100 mg via ORAL
  Filled 2021-04-10 (×3): qty 1

## 2021-04-10 MED ORDER — HYDROMORPHONE HCL 1 MG/ML IJ SOLN
0.5000 mg | Freq: Once | INTRAMUSCULAR | Status: AC
  Administered 2021-04-10: 0.5 mg via INTRAVENOUS
  Filled 2021-04-10 (×2): qty 1

## 2021-04-10 NOTE — ED Triage Notes (Signed)
Larey Seat off a ladder 6 ft, c/o trouble breathing and left flank pain

## 2021-04-10 NOTE — ED Provider Notes (Signed)
5:24 PM  D/w Dr. Bedelia Person. Given size will place chest tube. He is in no distress  CHEST TUBE INSERTION  Date/Time: 04/10/2021 6:19 PM Performed by: Pricilla Loveless, MD Authorized by: Pricilla Loveless, MD   Consent:    Consent obtained:  Verbal and written   Consent given by:  Patient   Risks, benefits, and alternatives were discussed: yes     Risks discussed:  Bleeding, damage to surrounding structures, infection, incomplete drainage, nerve damage and pain Universal protocol:    Patient identity confirmed:  Verbally with patient Pre-procedure details:    Skin preparation:  Povidone-iodine Sedation:    Sedation type:  None Anesthesia:    Anesthesia method:  Local infiltration   Local anesthetic:  Lidocaine 1% w/o epi Procedure details:    Placement location:  L lateral   Tube size (Fr):  Minicatheter   Tension pneumothorax: no     Suture material:  0 silk   Dressing:  Petrolatum-impregnated gauze and 4x4 sterile gauze Post-procedure details:    Post-insertion x-ray findings: tube in good position     Procedure completion:  Tolerated well, no immediate complications .Critical Care  Date/Time: 04/10/2021 11:40 PM Performed by: Pricilla Loveless, MD Authorized by: Pricilla Loveless, MD   Critical care provider statement:    Critical care time (minutes):  40   Critical care time was exclusive of:  Separately billable procedures and treating other patients   Critical care was necessary to treat or prevent imminent or life-threatening deterioration of the following conditions:  Trauma and respiratory failure   Critical care was time spent personally by me on the following activities:  Discussions with consultants, evaluation of patient's response to treatment, examination of patient, ordering and performing treatments and interventions, ordering and review of laboratory studies, ordering and review of radiographic studies, pulse oximetry, re-evaluation of patient's condition, obtaining  history from patient or surrogate and review of old charts   6:20 PM Lung appears re inflated on bedside Xray. No other significant injuries. Admit to trauma.    Pricilla Loveless, MD 04/10/21 [phone]

## 2021-04-10 NOTE — ED Notes (Signed)
Attempted to give report however nurse not available at this time. [facility] 10 min out.

## 2021-04-10 NOTE — ED Provider Notes (Signed)
[facility] Provider Note   CSN: [id number] Arrival date & time: 04/10/21  1347     History Chief Complaint  Patient presents with   Phillip Hayes    Phillip MoleWilliam E Ibe is a 70 y.o. male with hx of Type 2 DM, HTN, with history of HTN and Type 2 DM, presenting for evaluation of left chest and abdominal to left pelvis and left lateral hip pain which occurred suddenly when he fell off the top rung of a 6 foot ladder, landing on his left side onto cement.  Injury occurred just prior to arrival.  He reports increasing shortness of breath and pain with deep inspiration.  He did hit his but reports "mild" hit, denies headache or neck pain and had no LOC for the event.  No n/v since the event.  He reports a deep achy pain in his left arm and shoulder region but can fully ROM the extremity without increasing pain.  He has had no medications prior to arrival.  He has no weakness or numbness in his extremities.    The history is provided by the patient.      Past Medical History:  Diagnosis Date   Dorsalgia, unspecified    Essential (primary) hypertension    Gout, unspecified    Hypertension    Type 2 diabetes mellitus without complications (HCC)    Unspecified osteoarthritis, unspecified site     Patient Active Problem List   Diagnosis Date Noted   Pneumothorax 04/10/2021   Hyperlipidemia 09/24/2019   Thrombocytopenia (HCC) 09/24/2019   Dorsalgia, unspecified    Essential (primary) hypertension    Gout, unspecified    Type 2 diabetes mellitus without complications (HCC)    Unspecified osteoarthritis, unspecified site     Past Surgical History:  Procedure Laterality Date   none         Family History  Problem Relation Age of Onset   Healthy Mother    Heart disease Mother    Stroke Mother    Healthy Father     Social History   Tobacco Use   Smoking status: Never   Smokeless tobacco: Never  Substance Use Topics   Alcohol use: No   Drug use: No    Home  Medications Prior to Admission medications   Medication Sig Start Date End Date Taking? Authorizing Provider  amLODipine (NORVASC) 10 MG tablet Take 1 tablet (10 mg total) by mouth daily. 12/18/19   Wandra Feinsteinorum, Lisa L, MD  atenolol (TENORMIN) 50 MG tablet  09/15/17   [provider]  diclofenac (VOLTAREN) 75 MG EC tablet Take 1 tablet (75 mg total) by mouth 2 (two) times daily. 11/26/19   Corum, Minerva FesterLisa L, MD  olmesartan-hydrochlorothiazide (BENICAR HCT) 40-12.5 MG tablet Take 1 tablet by mouth daily.    [provider]  predniSONE (DELTASONE) 10 MG tablet Take 10 mg by mouth daily with breakfast. *prn    [provider]  rosuvastatin (CRESTOR) 20 MG tablet Take 20 mg by mouth daily.    [provider]    Allergies    Patient has no known allergies.  Review of Systems   Review of Systems  Constitutional:  Negative for fever.  HENT: Negative.    Eyes:  Negative for visual disturbance.  Respiratory:  Positive for shortness of breath.   Cardiovascular:  Positive for chest pain.  Gastrointestinal:  Positive for abdominal pain. Negative for nausea and vomiting.  Musculoskeletal:  Positive for arthralgias. Negative for joint swelling, myalgias  and neck pain.  Neurological:  Negative for dizziness, syncope, weakness, numbness and headaches.  All other systems reviewed and are negative.  Physical Exam Updated Vital Signs BP (!) 144/97   Pulse (!) 105   Temp 98.3 F (36.8 C) (Oral)   Resp (!) 25   SpO2 97%   Physical Exam Vitals and nursing note reviewed.  Constitutional:      Appearance: He is well-developed.  HENT:     Head: Normocephalic and atraumatic.  Eyes:     Conjunctiva/sclera: Conjunctivae normal.  Cardiovascular:     Rate and Rhythm: Regular rhythm. Tachycardia present.     Pulses: Normal pulses.     Heart sounds: Normal heart sounds.  Pulmonary:     Effort: Pulmonary effort is normal.     Breath sounds: Decreased air movement present.  Decreased breath sounds present. No wheezing.     Comments: Tender to palpation left anterior chest wall.  There is no palpable deformity, there is radiation of pain to his left flank but is not reproducible with palpation.  Poor respiratory effort secondary to pain. Chest:     Chest wall: Tenderness present.  Abdominal:     General: Bowel sounds are normal.     Palpations: Abdomen is soft.     Tenderness: There is no abdominal tenderness.       Comments: mild tenderness at his left upper quadrant without guarding.  Bony left pelvis through his greater trochanter is tender to palpation without obvious deformity.   Musculoskeletal:        General: Normal range of motion.     Cervical back: Normal range of motion. No rigidity or tenderness.  Skin:    General: Skin is warm and dry.  Neurological:     Mental Status: He is alert.    ED Results / Procedures / Treatments   Labs (all labs ordered are listed, but only abnormal results are displayed) Labs Reviewed  CBC WITH DIFFERENTIAL/PLATELET - Abnormal; Notable for the following components:      Result Value   WBC 12.4 (*)    Platelets 141 (*)    Neutro Abs 10.2 (*)    Abs Immature Granulocytes 0.08 (*)    All other components within normal limits  COMPREHENSIVE METABOLIC PANEL - Abnormal; Notable for the following components:   Glucose, Bld 166 (*)    BUN 44 (*)    Creatinine, Ser 1.46 (*)    Total Bilirubin 1.3 (*)    GFR, Estimated 52 (*)    All other components within normal limits  RESP PANEL BY RT-PCR (FLU A&B, COVID) ARPGX2  URINALYSIS, DIPSTICK ONLY  CBG MONITORING, ED  SAMPLE TO BLOOD BANK    EKG EKG Interpretation  Date/Time:  Friday April 10 2021 16:05:23 EDT Ventricular Rate:  66 PR Interval:  185 QRS Duration: 97 QT Interval:  398 QTC Calculation: 417 R Axis:   47 Text Interpretation: Sinus rhythm Low voltage, precordial leads no acute ST/T changes No old tracing to compare Confirmed by Pricilla Loveless  [phone]) on 04/10/2021 4:18:26 PM  Radiology CT HEAD WO CONTRAST ( )  Result Date: 04/10/2021 CLINICAL DATA:  Fall from ladder EXAM: CT HEAD WITHOUT CONTRAST TECHNIQUE: Contiguous axial images were obtained from the base of the skull through the vertex without intravenous contrast. COMPARISON:  10/04/2005 FINDINGS: Brain: No acute intracranial abnormality. Specifically, no hemorrhage, hydrocephalus, mass lesion, acute infarction, or significant intracranial injury. Mild age related volume loss. Vascular: No hyperdense vessel or unexpected calcification.  Skull: No acute calvarial abnormality. Sinuses/Orbits: No acute findings Other: None IMPRESSION: No acute intracranial abnormality. Electronically Signed   By: Charlett Nose M.D.   On: 04/10/2021 17:10   CT Chest W Contrast  Result Date: 04/10/2021 CLINICAL DATA:  Abdominal trauma Chest trauma, aortic injury suspected Rib fracture suspected, traumatic; Abdominal trauma. Fall from ladder EXAM: CT CHEST, ABDOMEN, AND PELVIS WITH CONTRAST TECHNIQUE: Multidetector CT imaging of the chest, abdomen and pelvis was performed following the standard protocol during bolus administration of intravenous contrast. CONTRAST:  28mL OMNIPAQUE IOHEXOL 350 MG/ML SOLN COMPARISON:  None. FINDINGS: CT CHEST FINDINGS Cardiovascular: Heart is normal size. Aorta is normal caliber. Moderate coronary artery calcifications. Scattered aortic calcifications. Mediastinum/Nodes: No mediastinal, hilar, or axillary adenopathy. Trachea and esophagus are unremarkable. Thyroid unremarkable. Lungs/Pleura: Moderate-sized left pneumothorax. Paraseptal emphysema. Calcified granuloma in the right apex. Areas of atelectasis in the lung bases. No effusions. Musculoskeletal: Chest wall soft tissues are unremarkable. Fracture through the posterolateral left 4th rib, nondisplaced. No additional visible fracture. CT ABDOMEN PELVIS FINDINGS Hepatobiliary: No hepatic injury or perihepatic hematoma. Gallbladder  is unremarkable. Pancreas: No focal abnormality or ductal dilatation. Spleen: No splenic injury or perisplenic hematoma. Adrenals/Urinary Tract: No adrenal hemorrhage or renal injury identified. Bladder is unremarkable. Bilateral renal parapelvic cysts. Stomach/Bowel: Normal appendix. Stomach, large and small bowel grossly unremarkable. Vascular/Lymphatic: Aortic atherosclerosis. No evidence of aneurysm or adenopathy. Reproductive: No visible focal abnormality. Other: No free fluid or free air. Small bilateral inguinal hernias containing fat. Musculoskeletal: No acute bony abnormality. IMPRESSION: Fracture through the posterolateral left 4th rib. Moderate left pneumothorax. Coronary artery disease. Aortic atherosclerosis, emphysema. No solid organ injury in the abdomen. These results were called by telephone at the time of interpretation on 04/10/2021 at 5:15 pm to provider Pricilla Loveless, who verbally acknowledged these results. Electronically Signed   By: Charlett Nose M.D.   On: 04/10/2021 17:16   CT Cervical Spine Wo Contrast  Result Date: 04/10/2021 CLINICAL DATA:  Polytrauma, critical, head/C-spine injury suspected. Fall from ladder. EXAM: CT CERVICAL SPINE WITHOUT CONTRAST TECHNIQUE: Multidetector CT imaging of the cervical spine was performed without intravenous contrast. Multiplanar CT image reconstructions were also generated. COMPARISON:  None. FINDINGS: Alignment: Normal Skull base and vertebrae: No acute fracture. No primary bone lesion or focal pathologic process. Soft tissues and spinal canal: No prevertebral fluid or swelling. No visible canal hematoma. Disc levels: Early degenerative spurring both anteriorly and posteriorly throughout the cervical spine. Disc space narrowing in the lower cervical spine. Early degenerative facet disease bilaterally. Upper chest: Left apical pneumothorax. This is better seen and discussed on chest CT report. Other: None IMPRESSION: No acute bony abnormality in the  cervical spine. Left pneumothorax.  See chest CT report for further discussion. Electronically Signed   By: Charlett Nose M.D.   On: 04/10/2021 17:36   CT ABDOMEN PELVIS W CONTRAST  Result Date: 04/10/2021 CLINICAL DATA:  Abdominal trauma Chest trauma, aortic injury suspected Rib fracture suspected, traumatic; Abdominal trauma. Fall from ladder EXAM: CT CHEST, ABDOMEN, AND PELVIS WITH CONTRAST TECHNIQUE: Multidetector CT imaging of the chest, abdomen and pelvis was performed following the standard protocol during bolus administration of intravenous contrast. CONTRAST:  57mL OMNIPAQUE IOHEXOL 350 MG/ML SOLN COMPARISON:  None. FINDINGS: CT CHEST FINDINGS Cardiovascular: Heart is normal size. Aorta is normal caliber. Moderate coronary artery calcifications. Scattered aortic calcifications. Mediastinum/Nodes: No mediastinal, hilar, or axillary adenopathy. Trachea and esophagus are unremarkable. Thyroid unremarkable. Lungs/Pleura: Moderate-sized left pneumothorax. Paraseptal emphysema. Calcified granuloma in the right  apex. Areas of atelectasis in the lung bases. No effusions. Musculoskeletal: Chest wall soft tissues are unremarkable. Fracture through the posterolateral left 4th rib, nondisplaced. No additional visible fracture. CT ABDOMEN PELVIS FINDINGS Hepatobiliary: No hepatic injury or perihepatic hematoma. Gallbladder is unremarkable. Pancreas: No focal abnormality or ductal dilatation. Spleen: No splenic injury or perisplenic hematoma. Adrenals/Urinary Tract: No adrenal hemorrhage or renal injury identified. Bladder is unremarkable. Bilateral renal parapelvic cysts. Stomach/Bowel: Normal appendix. Stomach, large and small bowel grossly unremarkable. Vascular/Lymphatic: Aortic atherosclerosis. No evidence of aneurysm or adenopathy. Reproductive: No visible focal abnormality. Other: No free fluid or free air. Small bilateral inguinal hernias containing fat. Musculoskeletal: No acute bony abnormality. IMPRESSION:  Fracture through the posterolateral left 4th rib. Moderate left pneumothorax. Coronary artery disease. Aortic atherosclerosis, emphysema. No solid organ injury in the abdomen. These results were called by telephone at the time of interpretation on 04/10/2021 at 5:15 pm to provider Pricilla Loveless, who verbally acknowledged these results. Electronically Signed   By: Charlett Nose M.D.   On: 04/10/2021 17:16   DG Chest Portable 1 View  Result Date: 04/10/2021 CLINICAL DATA:  Chest 2 EXAM: PORTABLE CHEST 1 VIEW COMPARISON:  04/10/2021 FINDINGS: Interim placement of small bore left-sided chest tube with tip over the mid chest. Decreased left pneumothorax with trace left apical pneumothorax. Low lung volumes. Mild cardiomegaly. Left third lateral rib fracture. IMPRESSION: Interim placement of left-sided chest tube with decreased left pneumothorax. Trace residual left apical pneumothorax. Electronically Signed   By: Jasmine Pang M.D.   On: 04/10/2021 18:27   DG Chest Portable 1 View  Result Date: 04/10/2021 CLINICAL DATA:  Larey Seat off ladder EXAM: PORTABLE CHEST 1 VIEW COMPARISON:  08/03/2010 FINDINGS: Low lung volumes. Small borderline moderate left pneumothorax estimated at about 20%. No midline shift. Normal cardiac size. Age indeterminate right third lateral rib fracture. IMPRESSION: 1. Positive for left pneumothorax estimated at at least 20%. No midline shift. Critical Value/emergent results were called by telephone at the time of interpretation on 04/10/2021 at 5:00 pm to provider Pricilla Loveless , who verbally acknowledged these results. Electronically Signed   By: Jasmine Pang M.D.   On: 04/10/2021 17:01    Procedures Procedures   Medications Ordered in ED Medications  lidocaine-EPINEPHrine (XYLOCAINE W/EPI) 2 %-1:200000 (PF) injection 20 mL (20 mLs Infiltration Not Given 04/10/21 1751)  ondansetron (ZOFRAN) injection 4 mg (4 mg Intravenous Given 04/10/21 1600)  morphine 4 MG/ML injection 4 mg (4 mg Intravenous  Given 04/10/21 1601)  iohexol (OMNIPAQUE) 350 MG/ML injection 80 mL (85 mLs Intravenous Contrast Given 04/10/21 1633)  HYDROmorphone (DILAUDID) injection 0.5 mg (0.5 mg Intravenous Given 04/10/21 1748)  ondansetron (ZOFRAN) injection 4 mg (4 mg Intravenous Given 04/10/21 1748)  ketorolac (TORADOL) 30 MG/ML injection 15 mg (15 mg Intravenous Given 04/10/21 1819)    ED Course  I have reviewed the triage vital signs and the nursing notes.  Pertinent labs & imaging results that were available during my care of the patient were reviewed by me and considered in my medical decision making (see chart for details).  Clinical Course as of 04/10/21 1324  Caleen Essex Apr 10, 2021  1712 CT Chest W Contrast [JI]    Clinical Course User Index [JI] Victoriano Lain   MDM Rules/Calculators/A&P                          CRITICAL CARE Performed by: Burgess Amor Total critical care time: 45 minutes Critical care  time was exclusive of separately billable procedures and treating other patients. Critical care was necessary to treat or prevent imminent or life-threatening deterioration. Critical care was time spent personally by me on the following activities: development of treatment plan with patient and/or surrogate as well as nursing, discussions with consultants, evaluation of patient's response to treatment, examination of patient, obtaining history from patient or surrogate, ordering and performing treatments and interventions, ordering and review of laboratory studies, ordering and review of radiographic studies, pulse oximetry and re-evaluation of patient's condition.  Patient with traumatic pneumothorax after fall from a 6 foot ladder.  See procedure note by Dr. Criss Alvine who inserted chest tube.  Patient will be a direct admit to the trauma service at [facility]. Final Clinical Impression(s) / ED Diagnoses Final diagnoses:  Traumatic pneumothorax, initial encounter    Rx / DC Orders ED Discharge Orders     None         Victoriano Lain 04/10/21 1842    Eber Hong, MD 04/11/21 [phone]

## 2021-04-10 NOTE — ED Notes (Signed)
2 lpm applied nasal cannula for comfort

## 2021-04-10 NOTE — H&P (Signed)
Reason for Consult/Chief Complaint: pneumothorax Consultant: Criss Alvine, MD  Phillip Hayes is an 70 y.o. male.   HPI: 1M s/p fall off a stepladder while standing on "the step that says do not stand on this step or higher" when the ladder slipped from underneath him. He reports landing on brick pavers. Believes he hit his head on the ground at the time of the fall. Pain of L hip and buttocks, L ribs. Reports breathing is improved after chest tube placement.   Past Medical History:  Diagnosis Date   Dorsalgia, unspecified    Essential (primary) hypertension    Gout, unspecified    Hypertension    Type 2 diabetes mellitus without complications (HCC)    Unspecified osteoarthritis, unspecified site     Past Surgical History:  Procedure Laterality Date   none      Family History  Problem Relation Age of Onset   Healthy Mother    Heart disease Mother    Stroke Mother    Healthy Father     Social History:  reports that he has never smoked. He has never used smokeless tobacco. He reports that he does not drink alcohol and does not use drugs.  Allergies: No Known Allergies  Medications: I have reviewed the patient's current medications.  Results for orders placed or performed during the hospital encounter of 04/10/21 (from the past 48 hour(s))  Sample to Blood Bank     Status: None   Collection Time: 04/10/21  2:47 PM  Result Value Ref Range   Blood Bank Specimen SAMPLE AVAILABLE FOR TESTING    Sample Expiration      04/13/2021,2359 Performed at [facility], [address] [place], [place] [address]   CBC with Differential/Platelet     Status: Abnormal   Collection Time: 04/10/21  2:47 PM  Result Value Ref Range   WBC 12.4 (H) 4.0 - 10.5 K/uL   RBC 5.32 4.22 - 5.81 MIL/uL   Hemoglobin 16.2 13.0 - 17.0 g/dL   HCT 62.1 30.8 - 65.7 %   MCV 93.4 80.0 - 100.0 fL   MCH 30.5 26.0 - 34.0 pg   MCHC 32.6 30.0 - 36.0 g/dL   RDW 84.6 96.2 - 95.2 %   Platelets 141 (L) 150 -  400 K/uL   nRBC 0.0 0.0 - 0.2 %   Neutrophils Relative % 81 %   Neutro Abs 10.2 (H) 1.7 - 7.7 K/uL   Lymphocytes Relative 11 %   Lymphs Abs 1.3 0.7 - 4.0 K/uL   Monocytes Relative 4 %   Monocytes Absolute 0.5 0.1 - 1.0 K/uL   Eosinophils Relative 2 %   Eosinophils Absolute 0.2 0.0 - 0.5 K/uL   Basophils Relative 1 %   Basophils Absolute 0.1 0.0 - 0.1 K/uL   Immature Granulocytes 1 %   Abs Immature Granulocytes 0.08 (H) 0.00 - 0.07 K/uL    Comment: Performed at [facility], [address], [place], [place] [address]  Comprehensive metabolic panel     Status: Abnormal   Collection Time: 04/10/21  2:47 PM  Result Value Ref Range   Sodium 139 135 - 145 mmol/L   Potassium 3.6 3.5 - 5.1 mmol/L   Chloride 104 98 - 111 mmol/L   CO2 25 22 - 32 mmol/L   Glucose, Bld 166 (H) 70 - 99 mg/dL    Comment: Glucose reference range applies only to samples taken after fasting for at least 8 hours.   BUN 44 (  H) 8 - 23 mg/dL   Creatinine, Ser 4.81 (H) 0.61 - 1.24 mg/dL   Calcium 9.5 8.9 - 85.6 mg/dL   Total Protein 7.7 6.5 - 8.1 g/dL   Albumin 4.5 3.5 - 5.0 g/dL   AST 22 15 - 41 U/L   ALT 19 0 - 44 U/L   Alkaline Phosphatase 67 38 - 126 U/L   Total Bilirubin 1.3 (H) 0.3 - 1.2 mg/dL   GFR, Estimated 52 (L) >60 mL/min    Comment: (NOTE) Calculated using the CKD-EPI Creatinine Equation (2021)    Anion gap 10 5 - 15    Comment: Performed at [facility], [address], [place], [place] [address]    CT HEAD WO CONTRAST ( )  Result Date: 04/10/2021 CLINICAL DATA:  Fall from ladder EXAM: CT HEAD WITHOUT CONTRAST TECHNIQUE: Contiguous axial images were obtained from the base of the skull through the vertex without intravenous contrast. COMPARISON:  10/04/2005 FINDINGS: Brain: No acute intracranial abnormality. Specifically, no hemorrhage, hydrocephalus, mass lesion, acute infarction, or significant intracranial injury. Mild age related volume loss. Vascular: No hyperdense vessel or unexpected  calcification. Skull: No acute calvarial abnormality. Sinuses/Orbits: No acute findings Other: None IMPRESSION: No acute intracranial abnormality. Electronically Signed   By: Charlett Nose M.D.   On: 04/10/2021 17:10   DG Chest Portable 1 View  Result Date: 04/10/2021 CLINICAL DATA:  Larey Seat off ladder EXAM: PORTABLE CHEST 1 VIEW COMPARISON:  08/03/2010 FINDINGS: Low lung volumes. Small borderline moderate left pneumothorax estimated at about 20%. No midline shift. Normal cardiac size. Age indeterminate right third lateral rib fracture. IMPRESSION: 1. Positive for left pneumothorax estimated at at least 20%. No midline shift. Critical Value/emergent results were called by telephone at the time of interpretation on 04/10/2021 at 5:00 pm to provider Pricilla Loveless , who verbally acknowledged these results. Electronically Signed   By: Jasmine Pang M.D.   On: 04/10/2021 17:01    ROS 10 point review of systems is negative except as listed above in HPI.   Physical Exam Blood pressure (!) 141/89, pulse 77, temperature 98.3 F (36.8 C), temperature source Oral, resp. rate 18, SpO2 95 %. Constitutional: well-developed, well-nourished HEENT: pupils equal, round, reactive to light, 101mm b/l, moist conjunctiva, external inspection of ears and nose normal, hearing intact Oropharynx: normal oropharyngeal mucosa, normal dentition Neck: no thyromegaly, trachea midline, no midline cervical tenderness to palpation Chest: breath sounds equal bilaterally, limited respiratory effort 2/2 pain, + L lateral chest wall tenderness to palpation Abdomen: soft, NT, no bruising, no hepatosplenomegaly GU: no blood at urethral meatus of penis, no scrotal masses or abnormality  Back: no wounds, no thoracic/lumbar spine tenderness to palpation, no thoracic/lumbar spine stepoffs Rectal: deferred Extremities: 2+ radial and pedal pulses bilaterally, motor and sensation intact to bilateral UE and LE, no peripheral edema MSK: unable to  assess gait/station, no clubbing/cyanosis of fingers/toes, normal ROM of all four extremities Skin: warm, dry, no rashes Psych: normal memory, normal mood/affect    Assessment/Plan: [facility]  L PTX - L CT to sxn, CXR in AM, IS/pulm toilet L 4th rib fx - pain control, pulm toilet/IS FEN - regular diet DVT - SCDs, LMWH Dispo - med/surg, inpatient   Diamantina Monks, MD General and Trauma Surgery [facility]

## 2021-04-11 ENCOUNTER — Inpatient Hospital Stay (HOSPITAL_COMMUNITY): Payer: PPO

## 2021-04-11 LAB — HIV ANTIBODY (ROUTINE TESTING W REFLEX): HIV Screen 4th Generation wRfx: NONREACTIVE

## 2021-04-11 MED ORDER — OLMESARTAN MEDOXOMIL-HCTZ 40-12.5 MG PO TABS: 1.0000 | ORAL_TABLET | Freq: Every day | ORAL | Status: DC

## 2021-04-11 MED ORDER — AMLODIPINE BESYLATE 10 MG PO TABS
10.0000 mg | ORAL_TABLET | Freq: Every day | ORAL | Status: DC
  Administered 2021-04-11 – 2021-04-12 (×2): 10 mg via ORAL
  Filled 2021-04-11 (×2): qty 1

## 2021-04-11 MED ORDER — ATENOLOL 25 MG PO TABS
50.0000 mg | ORAL_TABLET | Freq: Every day | ORAL | Status: DC
  Administered 2021-04-11 – 2021-04-12 (×2): 50 mg via ORAL
  Filled 2021-04-11 (×2): qty 2

## 2021-04-11 MED ORDER — OXYCODONE HCL 5 MG PO TABS
5.0000 mg | ORAL_TABLET | ORAL | Status: DC | PRN
Start: 2021-04-11 — End: 2021-04-12
  Administered 2021-04-11: 5 mg via ORAL
  Filled 2021-04-11: qty 1

## 2021-04-11 MED ORDER — DICLOFENAC SODIUM 75 MG PO TBEC
75.0000 mg | DELAYED_RELEASE_TABLET | Freq: Every day | ORAL | Status: DC
  Administered 2021-04-11 – 2021-04-12 (×2): 75 mg via ORAL
  Filled 2021-04-11 (×3): qty 1

## 2021-04-11 MED ORDER — HYDROCHLOROTHIAZIDE 12.5 MG PO CAPS
12.5000 mg | ORAL_CAPSULE | Freq: Every day | ORAL | Status: DC
  Administered 2021-04-11 – 2021-04-12 (×2): 12.5 mg via ORAL
  Filled 2021-04-11 (×2): qty 1

## 2021-04-11 MED ORDER — IRBESARTAN 300 MG PO TABS
300.0000 mg | ORAL_TABLET | Freq: Every day | ORAL | Status: DC
  Administered 2021-04-11 – 2021-04-12 (×2): 300 mg via ORAL
  Filled 2021-04-11 (×2): qty 1

## 2021-04-11 NOTE — Progress Notes (Signed)
Subjective: Pain is well controlled this am, but hasn't mobilized yet.  No SOB.  Hungry, has just ordered food.  In good spirits with family at bedside  ROS: See above, otherwise other systems negative  Objective: Vital signs in last 24 hours: Temp:  [97.8 F (36.6 C)-98.3 F (36.8 C)] 97.8 F (36.6 C) (08/06 0344) Pulse Rate:  [58-105] 63 (08/06 0344) Resp:  [15-27] 18 (08/06 0344) BP: (120-160)/(69-135) 131/69 (08/06 0344) SpO2:  [90 %-97 %] 95 % (08/06 0344) Last BM Date: 04/10/21  Intake/Output from previous day: 08/05 0701 - 08/06 0700 In: 197.4 [I.V.:197.4] Out: 0  Intake/Output this shift: No intake/output data recorded.  PE: Gen: NAD HEENT: PERRL Heart: regular Lungs: CTAB, left CT in place with no airleak and no output, turned to waterseal Abd: soft, NT, ND Ext: MAE Neuro: sensation normal Psych: A&Ox3  Lab Results:  Recent Labs    04/10/21 1447  WBC 12.4*  HGB 16.2  HCT 49.7  PLT 141*   BMET Recent Labs    04/10/21 1447  NA 139  K 3.6  CL 104  CO2 25  GLUCOSE 166*  BUN 44*  CREATININE 1.46*  CALCIUM 9.5   PT/INR No results for input(s): LABPROT, INR in the last 72 hours. CMP     Component Value Date/Time   NA 139 04/10/2021 1447   NA 141 04/06/2019 0000   K 3.6 04/10/2021 1447   CL 104 04/10/2021 1447   CO2 25 04/10/2021 1447   GLUCOSE 166 (H) 04/10/2021 1447   BUN 44 (H) 04/10/2021 1447   BUN 23 (A) 04/06/2019 0000   CREATININE 1.46 (H) 04/10/2021 1447   CALCIUM 9.5 04/10/2021 1447   PROT 7.7 04/10/2021 1447   ALBUMIN 4.5 04/10/2021 1447   AST 22 04/10/2021 1447   ALT 19 04/10/2021 1447   ALKPHOS 67 04/10/2021 1447   BILITOT 1.3 (H) 04/10/2021 1447   GFRNONAA 52 (L) 04/10/2021 1447   GFRAA 75 04/06/2019 0000   Lipase  No results found for: LIPASE     Studies/Results: CT HEAD WO CONTRAST ( )  Result Date: 04/10/2021 CLINICAL DATA:  Fall from ladder EXAM: CT HEAD WITHOUT CONTRAST TECHNIQUE: Contiguous axial  images were obtained from the base of the skull through the vertex without intravenous contrast. COMPARISON:  10/04/2005 FINDINGS: Brain: No acute intracranial abnormality. Specifically, no hemorrhage, hydrocephalus, mass lesion, acute infarction, or significant intracranial injury. Mild age related volume loss. Vascular: No hyperdense vessel or unexpected calcification. Skull: No acute calvarial abnormality. Sinuses/Orbits: No acute findings Other: None IMPRESSION: No acute intracranial abnormality. Electronically Signed   By: Charlett Nose M.D.   On: 04/10/2021 17:10   CT Chest W Contrast  Result Date: 04/10/2021 CLINICAL DATA:  Abdominal trauma Chest trauma, aortic injury suspected Rib fracture suspected, traumatic; Abdominal trauma. Fall from ladder EXAM: CT CHEST, ABDOMEN, AND PELVIS WITH CONTRAST TECHNIQUE: Multidetector CT imaging of the chest, abdomen and pelvis was performed following the standard protocol during bolus administration of intravenous contrast. CONTRAST:  90mL OMNIPAQUE IOHEXOL 350 MG/ML SOLN COMPARISON:  None. FINDINGS: CT CHEST FINDINGS Cardiovascular: Heart is normal size. Aorta is normal caliber. Moderate coronary artery calcifications. Scattered aortic calcifications. Mediastinum/Nodes: No mediastinal, hilar, or axillary adenopathy. Trachea and esophagus are unremarkable. Thyroid unremarkable. Lungs/Pleura: Moderate-sized left pneumothorax. Paraseptal emphysema. Calcified granuloma in the right apex. Areas of atelectasis in the lung bases. No effusions. Musculoskeletal: Chest wall soft tissues are unremarkable. Fracture through the posterolateral left 4th rib,  nondisplaced. No additional visible fracture. CT ABDOMEN PELVIS FINDINGS Hepatobiliary: No hepatic injury or perihepatic hematoma. Gallbladder is unremarkable. Pancreas: No focal abnormality or ductal dilatation. Spleen: No splenic injury or perisplenic hematoma. Adrenals/Urinary Tract: No adrenal hemorrhage or renal injury  identified. Bladder is unremarkable. Bilateral renal parapelvic cysts. Stomach/Bowel: Normal appendix. Stomach, large and small bowel grossly unremarkable. Vascular/Lymphatic: Aortic atherosclerosis. No evidence of aneurysm or adenopathy. Reproductive: No visible focal abnormality. Other: No free fluid or free air. Small bilateral inguinal hernias containing fat. Musculoskeletal: No acute bony abnormality. IMPRESSION: Fracture through the posterolateral left 4th rib. Moderate left pneumothorax. Coronary artery disease. Aortic atherosclerosis, emphysema. No solid organ injury in the abdomen. These results were called by telephone at the time of interpretation on 04/10/2021 at 5:15 pm to provider Pricilla Loveless, who verbally acknowledged these results. Electronically Signed   By: Charlett Nose M.D.   On: 04/10/2021 17:16   CT Cervical Spine Wo Contrast  Result Date: 04/10/2021 CLINICAL DATA:  Polytrauma, critical, head/C-spine injury suspected. Fall from ladder. EXAM: CT CERVICAL SPINE WITHOUT CONTRAST TECHNIQUE: Multidetector CT imaging of the cervical spine was performed without intravenous contrast. Multiplanar CT image reconstructions were also generated. COMPARISON:  None. FINDINGS: Alignment: Normal Skull base and vertebrae: No acute fracture. No primary bone lesion or focal pathologic process. Soft tissues and spinal canal: No prevertebral fluid or swelling. No visible canal hematoma. Disc levels: Early degenerative spurring both anteriorly and posteriorly throughout the cervical spine. Disc space narrowing in the lower cervical spine. Early degenerative facet disease bilaterally. Upper chest: Left apical pneumothorax. This is better seen and discussed on chest CT report. Other: None IMPRESSION: No acute bony abnormality in the cervical spine. Left pneumothorax.  See chest CT report for further discussion. Electronically Signed   By: Charlett Nose M.D.   On: 04/10/2021 17:36   CT ABDOMEN PELVIS W  CONTRAST  Result Date: 04/10/2021 CLINICAL DATA:  Abdominal trauma Chest trauma, aortic injury suspected Rib fracture suspected, traumatic; Abdominal trauma. Fall from ladder EXAM: CT CHEST, ABDOMEN, AND PELVIS WITH CONTRAST TECHNIQUE: Multidetector CT imaging of the chest, abdomen and pelvis was performed following the standard protocol during bolus administration of intravenous contrast. CONTRAST:  36mL OMNIPAQUE IOHEXOL 350 MG/ML SOLN COMPARISON:  None. FINDINGS: CT CHEST FINDINGS Cardiovascular: Heart is normal size. Aorta is normal caliber. Moderate coronary artery calcifications. Scattered aortic calcifications. Mediastinum/Nodes: No mediastinal, hilar, or axillary adenopathy. Trachea and esophagus are unremarkable. Thyroid unremarkable. Lungs/Pleura: Moderate-sized left pneumothorax. Paraseptal emphysema. Calcified granuloma in the right apex. Areas of atelectasis in the lung bases. No effusions. Musculoskeletal: Chest wall soft tissues are unremarkable. Fracture through the posterolateral left 4th rib, nondisplaced. No additional visible fracture. CT ABDOMEN PELVIS FINDINGS Hepatobiliary: No hepatic injury or perihepatic hematoma. Gallbladder is unremarkable. Pancreas: No focal abnormality or ductal dilatation. Spleen: No splenic injury or perisplenic hematoma. Adrenals/Urinary Tract: No adrenal hemorrhage or renal injury identified. Bladder is unremarkable. Bilateral renal parapelvic cysts. Stomach/Bowel: Normal appendix. Stomach, large and small bowel grossly unremarkable. Vascular/Lymphatic: Aortic atherosclerosis. No evidence of aneurysm or adenopathy. Reproductive: No visible focal abnormality. Other: No free fluid or free air. Small bilateral inguinal hernias containing fat. Musculoskeletal: No acute bony abnormality. IMPRESSION: Fracture through the posterolateral left 4th rib. Moderate left pneumothorax. Coronary artery disease. Aortic atherosclerosis, emphysema. No solid organ injury in the  abdomen. These results were called by telephone at the time of interpretation on 04/10/2021 at 5:15 pm to provider Pricilla Loveless, who verbally acknowledged these results. Electronically Signed  By: Charlett Nose M.D.   On: 04/10/2021 17:16   DG Chest Port 1 View  Result Date: 04/11/2021 CLINICAL DATA:  70 year old male with history of shortness of breath. Left-sided pneumothorax. EXAM: PORTABLE CHEST 1 VIEW COMPARISON:  Chest x-ray 04/10/2021. FINDINGS: Small bore left-sided chest tube in position with tip projecting over the left mid hemithorax. No appreciable left-sided pneumothorax confidently identified on today's examination. Lung volumes are low. No consolidative airspace disease. No pleural effusions. No pneumothorax. No pulmonary nodule or mass noted. Pulmonary vasculature and the cardiomediastinal silhouette are within normal limits. IMPRESSION: 1. Stable position of left-sided chest tube. Low lung volumes with no definite residual pneumothorax confidently identified. Electronically Signed   By: Trudie Reed M.D.   On: 04/11/2021 09:05   DG Chest Portable 1 View  Result Date: 04/10/2021 CLINICAL DATA:  Chest 2 EXAM: PORTABLE CHEST 1 VIEW COMPARISON:  04/10/2021 FINDINGS: Interim placement of small bore left-sided chest tube with tip over the mid chest. Decreased left pneumothorax with trace left apical pneumothorax. Low lung volumes. Mild cardiomegaly. Left third lateral rib fracture. IMPRESSION: Interim placement of left-sided chest tube with decreased left pneumothorax. Trace residual left apical pneumothorax. Electronically Signed   By: Jasmine Pang M.D.   On: 04/10/2021 18:27   DG Chest Portable 1 View  Result Date: 04/10/2021 CLINICAL DATA:  Larey Seat off ladder EXAM: PORTABLE CHEST 1 VIEW COMPARISON:  08/03/2010 FINDINGS: Low lung volumes. Small borderline moderate left pneumothorax estimated at about 20%. No midline shift. Normal cardiac size. Age indeterminate right third lateral rib  fracture. IMPRESSION: 1. Positive for left pneumothorax estimated at at least 20%. No midline shift. Critical Value/emergent results were called by telephone at the time of interpretation on 04/10/2021 at 5:00 pm to provider Pricilla Loveless , who verbally acknowledged these results. Electronically Signed   By: Jasmine Pang M.D.   On: 04/10/2021 17:01    Anti-infectives: Anti-infectives (From admission, onward)    None        Assessment/Plan [facility] L PTX - L CT to waterseal today, CXR in AM, if stable, like DC CT in am and home tomorrow afternoon, IS/pulm toilet L 4th rib fx - pain control, pulm toilet/IS HTN - resume home meds FEN - regular diet DVT - SCDs, LMWH Dispo - med/surg, inpatient, hopefully home tomorrow    LOS: 1 day    Letha Cape , [facility] 04/11/2021, 9:46 AM Please see Amion for pager number during day hours 7:00am-4:30pm or 7:00am -11:30am on weekends

## 2021-04-11 NOTE — Progress Notes (Signed)
Mobility Specialist: Progress Note   04/11/21 1214  Mobility  Activity Ambulated in hall  Level of Assistance Contact guard assist, steadying assist  Assistive Device Front wheel walker  Distance Ambulated (ft) 190 ft  Mobility Ambulated with assistance in hallway  Mobility Response Tolerated well  Mobility performed by Mobility specialist  $Mobility charge 1 Mobility   Pre-Mobility: 60 HR, 97% SpO2 Post-Mobility: 75 HR, 96% SpO2  Pt required minA to sit EOB and contact guard to stand. Pt c/o chest and back pain during ambulation, otherwise asx. Pt sitting EOB after walk with family members present in the room.   [facility] Taris Galindo Mobility Specialist Mobility Specialist Phone: [phone]

## 2021-04-11 NOTE — Plan of Care (Signed)

## 2021-04-11 NOTE — Evaluation (Signed)
Physical Therapy Evaluation Patient Details Name: Phillip Hayes MRN: [id number] DOB: [birth year] Today's Date: 04/11/2021   History of Present Illness  70yo male admitted 04/10/21 after falling off of a step ladder. Found to have L sided PTX (treated with chest tube placement), and L 4th rib fracture. PMH HTN, gout, DM, OA  Clinical Impression   Patient received sitting up in chair, very pleasant and cooperative. Able to perform functional transfers and gait in the hallway with min guard/RW with good mechanics and tolerance, VSS on RA. Did need up to [facility] when balance was challenged without BUE support. Politely refuses OP PT f/u. Left up in chair with all needs met this afternoon- in this case we will plan to see him in the morning for further activity and HEP before possible DC home.     Follow Up Recommendations No PT follow up;Other (comment) (politely refusing OP PT)    Equipment Recommendations  Rolling walker with 5" wheels    Recommendations for Other Services       Precautions / Restrictions Precautions Precautions: Fall;Other (comment) Precaution Comments: chest tube on water seal, L rib fracture Restrictions Weight Bearing Restrictions: No      Mobility  Bed Mobility               General bed mobility comments: up in chair    Transfers Overall transfer level: Needs assistance Equipment used: Rolling walker (2 wheeled) Transfers: Sit to/from Stand Sit to Stand: Min guard         General transfer comment: min guard for balance/safety especially since chair did not have arms from which to push from  Ambulation/Gait Ambulation/Gait assistance: Supervision Gait Distance (Feet): 200 Feet Assistive device: Rolling walker (2 wheeled) Gait Pattern/deviations: Step-through pattern;WFL(Within Functional Limits) Gait velocity: decreased   General Gait Details: gait pattern WNL with RW, slight trunk flexion but steady  Stairs            Wheelchair  Mobility    Modified Rankin (Stroke Patients Only)       Balance Overall balance assessment: Needs assistance Sitting-balance support: Feet supported Sitting balance-Leahy Scale: Good     Standing balance support: No upper extremity supported;During functional activity Standing balance-Leahy Scale: Poor Standing balance comment: MinA to maintain balance while marching in place without BUE support                             Pertinent Vitals/Pain Pain Assessment: No/denies pain    Home Living Family/patient expects to be discharged to:: Private residence Living Arrangements: Spouse/significant other Available Help at Discharge: Family;Available 24 hours/day Type of Home: House Home Access: Stairs to enter Entrance Stairs-Rails: Left Entrance Stairs-Number of Steps: 1 STE with L ascending hand hold Home Layout: One level Home Equipment: Shower seat - built in;None Additional Comments: pastor at church; 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    Prior Function Level of Independence: Independent               Hand Dominance        Extremity/Trunk Assessment   Upper Extremity Assessment Upper Extremity Assessment: Defer to OT evaluation    Lower Extremity Assessment Lower Extremity Assessment: Generalized weakness    Cervical / Trunk Assessment Cervical / Trunk Assessment: Normal  Communication   Communication: HOH  Cognition Arousal/Alertness: Awake/alert Behavior During Therapy: WFL for tasks assessed/performed Overall Cognitive Status: Within Functional Limits for tasks assessed  General Comments      Exercises     Assessment/Plan    PT Assessment Patient needs continued PT services  PT Problem List Decreased strength;Decreased balance;Decreased coordination;Decreased mobility       PT Treatment Interventions DME instruction;Balance training;Gait training;Stair  training;Functional mobility training;Patient/family education;Therapeutic activities;Therapeutic exercise    PT Goals (Current goals can be found in the Care Plan section)  Acute Rehab PT Goals Patient Stated Goal: go home tomorrow PT Goal Formulation: With patient Time For Goal Achievement: 04/25/21 Potential to Achieve Goals: Good    Frequency Min 3X/week   Barriers to discharge        Co-evaluation               AM-PAC PT "6 Clicks" Mobility  Outcome Measure Help needed turning from your back to your side while in a flat bed without using bedrails?: A Little Help needed moving from lying on your back to sitting on the side of a flat bed without using bedrails?: A Little Help needed moving to and from a bed to a chair (including a wheelchair)?: A Little Help needed standing up from a chair using your arms (e.g., wheelchair or bedside chair)?: A Little Help needed to walk in hospital room?: A Little Help needed climbing 3-5 steps with a railing? : A Little 6 Click Score: 18    End of Session   Activity Tolerance: Patient tolerated treatment well Patient left: in chair;with call bell/phone within reach Nurse Communication: Mobility status PT Visit Diagnosis: Unsteadiness on feet (R26.81);Muscle weakness (generalized) (M62.81)    Time: 6619-6940 PT Time Calculation (min) (ACUTE ONLY): 13 min   Charges:   PT Evaluation $PT Eval Low Complexity: 1 Low        Windell Norfolk, DPT, PN2   Supplemental Physical Therapist [place]    Pager [phone] Acute Rehab Office [phone]

## 2021-04-11 NOTE — Evaluation (Signed)
Occupational Therapy Evaluation Patient Details Name: Phillip Hayes MRN: [id number] DOB: 10/13/1950 Today's Date: 04/11/2021    History of Present Illness 69yo male admitted 04/10/21 after falling off of a step ladder. Found to have L sided PTX (treated with chest tube placement), and L 4th rib fracture. PMH HTN, gout, DM, OA   Clinical Impression   Pt presented in chair and agreed to session. Pt reported slight discomfort with chest tube placement and was educated on DME/AE to be able to complete UE/LE dressing and bathing tasks to decrease strain to area. Pt tolerated session and had no LOB and use RW more for support of chest tube lines in session for room level distances. Signing off from acute Occupational Therapy services.      Follow Up Recommendations  Supervision - Intermittent    Equipment Recommendations   (long handle reacher)    Recommendations for Other Services       Precautions / Restrictions Precautions Precautions: Fall;Other (comment) Precaution Comments: chest tube on water seal, L rib fracture Restrictions Weight Bearing Restrictions: No      Mobility Bed Mobility Overal bed mobility:  (in chair)             General bed mobility comments: up in chair    Transfers Overall transfer level: Needs assistance Equipment used: Rolling walker (2 wheeled) Transfers: Sit to/from Stand Sit to Stand: Supervision         General transfer comment: min guard for balance/safety especially since chair did not have arms from which to push from    Balance Overall balance assessment: Needs assistance Sitting-balance support: Feet supported Sitting balance-Leahy Scale: Good     Standing balance support: No upper extremity supported;During functional activity Standing balance-Leahy Scale: Fair Standing balance comment: MinA to maintain balance while marching in place without BUE support                           ADL either performed or assessed  with clinical judgement   ADL Overall ADL's : Needs assistance/impaired Eating/Feeding: Independent;Sitting   Grooming: Wash/dry hands;Wash/dry face;Modified independent;Standing   Upper Body Bathing: Supervision/ safety;Sitting;Cueing for safety;Cueing for sequencing   Lower Body Bathing: Supervison/ safety;Cueing for safety;Sit to/from stand   Upper Body Dressing : Modified independent;Sitting   Lower Body Dressing: Supervision/safety;Sit to/from stand   Toilet Transfer: Supervision/safety;Cueing for Actor and Hygiene: Supervision/safety;Sit to/from stand   Tub/ Engineer, structural: Supervision/safety;Cueing for safety;Grab bars   Functional mobility during ADLs: Modified independent;Cueing for safety;Cueing for sequencing;Rolling walker       Vision         Perception     Praxis      Pertinent Vitals/Pain Pain Assessment: No/denies pain     Hand Dominance Right   Extremity/Trunk Assessment Upper Extremity Assessment Upper Extremity Assessment: LUE deficits/detail LUE Deficits / Details: decrease ROM due to chest tube placement LUE: Unable to fully assess due to pain   Lower Extremity Assessment Lower Extremity Assessment: Defer to PT evaluation   Cervical / Trunk Assessment Cervical / Trunk Assessment: Normal   Communication Communication Communication: HOH   Cognition Arousal/Alertness: Awake/alert Behavior During Therapy: WFL for tasks assessed/performed Overall Cognitive Status: Within Functional Limits for tasks assessed  General Comments       Exercises     Shoulder Instructions      Home Living Family/patient expects to be discharged to:: Private residence Living Arrangements: Spouse/significant other Available Help at Discharge: Family;Available 24 hours/day Type of Home: House Home Access: Stairs to enter [employer] of Steps:  1 STE with L ascending hand hold Entrance Stairs-Rails: Left Home Layout: One level     Bathroom Shower/Tub: Producer, television/film/video: Handicapped height     Home Equipment: Information systems manager - built in;None   Additional Comments: pastor at church; 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      Prior Functioning/Environment Level of Independence: Independent                 OT Problem List: Decreased strength;Decreased range of motion;Decreased activity tolerance;Impaired balance (sitting and/or standing);Pain;Decreased knowledge of use of DME or AE;Decreased knowledge of precautions      OT Treatment/Interventions:      OT Goals(Current goals can be found in the care plan section) Acute Rehab OT Goals Patient Stated Goal: go home soon OT Goal Formulation: With patient Time For Goal Achievement: 04/25/21 Potential to Achieve Goals: Good  OT Frequency:     Barriers to D/C:            Co-evaluation              AM-PAC OT "6 Clicks" Daily Activity     Outcome Measure Help from another person eating meals?: None Help from another person taking care of personal grooming?: None Help from another person toileting, which includes using toliet, bedpan, or urinal?: None Help from another person bathing (including washing, rinsing, drying)?: None Help from another person to put on and taking off regular upper body clothing?: None Help from another person to put on and taking off regular lower body clothing?: None 6 Click Score: 24   End of Session Equipment Utilized During Treatment: Gait belt  Activity Tolerance: Patient tolerated treatment well Patient left: in chair;with family/visitor present  OT Visit Diagnosis: Muscle weakness (generalized) (M62.81);Unsteadiness on feet (R26.81);History of falling (Z91.81)                Time: 5277-8242 OT Time Calculation (min): 13 min Charges:  OT General Charges $OT Visit: 1 Visit OT Evaluation $OT Eval Low  Complexity: 1 Low  Alphia Moh OTR/L  Acute Rehab Services  [phone] office number [phone] pager number   Alphia Moh 04/11/2021, 1:50 PM

## 2021-04-12 ENCOUNTER — Encounter (HOSPITAL_COMMUNITY): Payer: Self-pay

## 2021-04-12 ENCOUNTER — Other Ambulatory Visit: Payer: Self-pay

## 2021-04-12 ENCOUNTER — Inpatient Hospital Stay (HOSPITAL_COMMUNITY): Payer: PPO

## 2021-04-12 LAB — CBC
HCT: 40.9 % (ref 39.0–52.0)
Hemoglobin: 13.6 g/dL (ref 13.0–17.0)
MCH: 30.4 pg (ref 26.0–34.0)
MCHC: 33.3 g/dL (ref 30.0–36.0)
MCV: 91.3 fL (ref 80.0–100.0)
Platelets: 108 10*3/uL — ABNORMAL LOW (ref 150–400)
RBC: 4.48 MIL/uL (ref 4.22–5.81)
RDW: 13.1 % (ref 11.5–15.5)
WBC: 8.2 10*3/uL (ref 4.0–10.5)
nRBC: 0 % (ref 0.0–0.2)

## 2021-04-12 LAB — BASIC METABOLIC PANEL
Anion gap: 6 (ref 5–15)
BUN: 24 mg/dL — ABNORMAL HIGH (ref 8–23)
CO2: 30 mmol/L (ref 22–32)
Calcium: 8.7 mg/dL — ABNORMAL LOW (ref 8.9–10.3)
Chloride: 103 mmol/L (ref 98–111)
Creatinine, Ser: 1.21 mg/dL (ref 0.61–1.24)
GFR, Estimated: >60 mL/min (ref >60)
Glucose, Bld: 97 mg/dL (ref 70–99)
Potassium: 3.7 mmol/L (ref 3.5–5.1)
Sodium: 139 mmol/L (ref 135–145)

## 2021-04-12 MED ORDER — METHOCARBAMOL 500 MG PO TABS: 1000.0000 mg | ORAL_TABLET | Freq: Three times a day (TID) | ORAL | 0 refills | Status: AC | PRN

## 2021-04-12 MED ORDER — OXYCODONE HCL 5 MG PO TABS: 5.0000 mg | ORAL_TABLET | Freq: Four times a day (QID) | ORAL | 0 refills | Status: AC | PRN

## 2021-04-12 MED ORDER — ACETAMINOPHEN 500 MG PO TABS: 1000.0000 mg | ORAL_TABLET | Freq: Four times a day (QID) | ORAL | 0 refills | Status: AC | PRN

## 2021-04-12 NOTE — Progress Notes (Signed)
Patient discharged home and transported to [facility] by wheelchair. Telemetry discontinued and Peripheral IV's removed. AVS given with patient education completed.

## 2021-04-12 NOTE — Progress Notes (Signed)
Physical Therapy Treatment Patient Details Name: Phillip Hayes MRN: [id number] DOB: 1951-06-28 Today's Date: 04/12/2021    History of Present Illness 70yo male admitted 04/10/21 after falling off of a step ladder. Found to have L sided PTX (treated with chest tube placement), and L 4th rib fracture. PMH HTN, gout, DM, OA    PT Comments    Patient received in bed, pleasant and feeling much better today. Tended to move a bit faster/was more impulsive today than  yesterday however, needed cues for safety. Able to progress gait distance significantly, but does demonstrate ongoing balance deficits and would benefit from RW use for community distances and ambulation over uneven surfaces for the short term. Assigned tandem stance and SLS as HEP, able to perform correctly but SLS on L side somewhat limited due to trunk pain, expect this to improve as he continues to recover. Left in bed with all needs met, family present. OK for DC home once cleared by medical team from PT standpoint.     Follow Up Recommendations  No PT follow up;Other (comment) (politely refusing OP PT)     Equipment Recommendations  Rolling walker with 5" wheels    Recommendations for Other Services       Precautions / Restrictions Precautions Precautions: Fall;Other (comment) Precaution Comments: L rib fracture Restrictions Weight Bearing Restrictions: No    Mobility  Bed Mobility Overal bed mobility: Needs Assistance Bed Mobility: Supine to Sit     Supine to sit: Min guard     General bed mobility comments: increased time and effort, HOB elevated    Transfers Overall transfer level: Needs assistance Equipment used: Rolling walker (2 wheeled) Transfers: Sit to/from Stand Sit to Stand: Supervision         General transfer comment: S for safety, no physical assist given  Ambulation/Gait Ambulation/Gait assistance: Modified independent (Device/Increase time) Gait Distance (Feet): 400 Feet Assistive  device: Rolling walker (2 wheeled) Gait Pattern/deviations: Step-through pattern;WFL(Within Functional Limits) Gait velocity: decreased   General Gait Details: much better tolerance of increased distance today, slight trunk flexion and SpO2 no lower than 95% on RA after CT removal   Stairs             Wheelchair Mobility    Modified Rankin (Stroke Patients Only)       Balance Overall balance assessment: Needs assistance Sitting-balance support: Feet supported Sitting balance-Leahy Scale: Normal     Standing balance support: No upper extremity supported;During functional activity Standing balance-Leahy Scale: Fair                              Cognition Arousal/Alertness: Awake/alert Behavior During Therapy: WFL for tasks assessed/performed Overall Cognitive Status: Within Functional Limits for tasks assessed                                 General Comments: less insight into deficits today/more impulsive      Exercises      General Comments        Pertinent Vitals/Pain Pain Assessment: No/denies pain    Home Living                      Prior Function            PT Goals (current goals can now be found in the care plan section) Acute Rehab PT Goals Patient  Stated Goal: go home soon PT Goal Formulation: With patient Time For Goal Achievement: 04/25/21 Potential to Achieve Goals: Good Progress towards PT goals: Progressing toward goals    Frequency    Min 3X/week      PT Plan Current plan remains appropriate    Co-evaluation              AM-PAC PT "6 Clicks" Mobility   Outcome Measure  Help needed turning from your back to your side while in a flat bed without using bedrails?: A Little Help needed moving from lying on your back to sitting on the side of a flat bed without using bedrails?: A Little Help needed moving to and from a bed to a chair (including a wheelchair)?: None Help needed standing  up from a chair using your arms (e.g., wheelchair or bedside chair)?: None Help needed to walk in hospital room?: A Little Help needed climbing 3-5 steps with a railing? : A Little 6 Click Score: 20    End of Session   Activity Tolerance: Patient tolerated treatment well Patient left: in bed;with call bell/phone within reach;with family/visitor present Nurse Communication: Mobility status PT Visit Diagnosis: Unsteadiness on feet (R26.81);Muscle weakness (generalized) (M62.81)     Time: 2707-8675 PT Time Calculation (min) (ACUTE ONLY): 15 min  Charges:  $Gait Training: 8-22 mins                    Windell Norfolk, DPT, PN2   Supplemental Physical Therapist [place]    Pager [phone] Acute Rehab Office [phone]

## 2021-04-12 NOTE — Discharge Summary (Signed)
Patient ID: Phillip Hayes [id number] December 26, 1950 70 y.o.  Admit date: 04/10/2021 Discharge date: 04/12/2021  Admitting Diagnosis: Fall Right rib fx with PTX  Discharge Diagnosis Patient Active Problem List   Diagnosis Date Noted   Pneumothorax 04/10/2021   Hyperlipidemia 09/24/2019   Thrombocytopenia (HCC) 09/24/2019   Dorsalgia, unspecified    Essential (primary) hypertension    Gout, unspecified    Type 2 diabetes mellitus without complications (HCC)    Unspecified osteoarthritis, unspecified site     Consultants none  Reason for Admission: 7M s/p fall off a stepladder while standing on "the step that says do not stand on this step or higher" when the ladder slipped from underneath him. He reports landing on brick pavers. Believes he hit his head on the ground at the time of the fall. Pain of L hip and buttocks, L ribs. Reports breathing is improved after chest tube placement.   Procedures Chest tube placement by [facility] ED  Hospital Course:  The patient was admitted here for pain control and chest tube management.  His PTX resolved quickly and his CT was able to be removed on HD 2.  Follow up CXR was stable.  He was stable at this time for DC home.  Physical Exam: Gen: NAD Heart: regular Lungs: CTAB, CT removed with no issues and occlusive dressing placed Abd: soft, NT Ext: moves all 4 exts and mobilizes well in front of me  Psych: A&Ox3  Allergies as of 04/12/2021   No Known Allergies      Medication List     STOP taking these medications    predniSONE 10 MG tablet Commonly known as: DELTASONE       TAKE these medications    acetaminophen 500 MG tablet Commonly known as: TYLENOL Take 2 tablets (1,000 mg total) by mouth every 6 (six) hours as needed.   amLODipine 10 MG tablet Commonly known as: NORVASC Take 1 tablet (10 mg total) by mouth daily.   atenolol 50 MG tablet Commonly known as: TENORMIN Take 50 mg by mouth daily.   methocarbamol 500  MG tablet Commonly known as: ROBAXIN Take 2 tablets (1,000 mg total) by mouth every 8 (eight) hours as needed for muscle spasms.   olmesartan-hydrochlorothiazide 40-12.5 MG tablet Commonly known as: BENICAR HCT Take 1 tablet by mouth daily.   oxyCODONE 5 MG immediate release tablet Commonly known as: Oxy IR/ROXICODONE Take 1 tablet (5 mg total) by mouth every 6 (six) hours as needed for moderate pain.   rosuvastatin 20 MG tablet Commonly known as: CRESTOR Take 20 mg by mouth See admin instructions. Take 1 tablet by mouth on Monday,Wednesday, and Friday       ASK your doctor about these medications    diclofenac 75 MG EC tablet Commonly known as: VOLTAREN Take 1 tablet (75 mg total) by mouth 2 (two) times daily.          Follow-up Information     Benita Stabile, MD Follow up.   Specialty: Internal Medicine Why: As needed Contact information: [address] [place] [facility] [address] [phone]         [facility] Follow up on 04/21/2021.   Why: Go get your chest x-ray that has been ordered Contact information: [address] [place] [address] [phone]        [facility] Follow up.   Why: As needed, we will call you with x-ray results.  please call  us if you have not heard from [place] in 48 hrs after x-ray taken, sometimes it can take this long for the film to be read Contact information: [address] [place] [address] [phone]                Signed: Barnetta Chapel, [facility] 04/12/2021, 9:06 AM Please see Amion for pager number during day hours 7:00am-4:30pm, 7-11:30am on Weekends

## 2021-04-14 ENCOUNTER — Other Ambulatory Visit (HOSPITAL_COMMUNITY): Payer: Self-pay | Admitting: General Surgery

## 2021-04-14 DIAGNOSIS — Z8709 Personal history of other diseases of the respiratory system: Secondary | ICD-10-CM

## 2021-04-21 ENCOUNTER — Ambulatory Visit (HOSPITAL_COMMUNITY)
Admission: RE | Admit: 2021-04-21 | Discharge: 2021-04-21 | Disposition: A | Discharge status: Home / Self Care | Payer: PPO | Source: Ambulatory Visit | Attending: General Surgery | Admitting: General Surgery

## 2021-04-21 ENCOUNTER — Other Ambulatory Visit: Payer: Self-pay | Admitting: General Surgery

## 2021-04-21 ENCOUNTER — Other Ambulatory Visit: Payer: Self-pay

## 2021-04-21 DIAGNOSIS — S2242XA Multiple fractures of ribs, left side, initial encounter for closed fracture: Secondary | ICD-10-CM | POA: Diagnosis not present

## 2021-04-21 DIAGNOSIS — Z8709 Personal history of other diseases of the respiratory system: Secondary | ICD-10-CM | POA: Diagnosis not present

## 2021-04-21 NOTE — Progress Notes (Signed)
I called the patient today to check on him after he had his follow up CXR was done at [facility].  This CXR shows no evidence of PTX but his one rib fracture is still noted.  The patient is still having some pain as expected, but denies SOB, coughing, or other symptoms.  He is using Tylenol and Roxabin for pain control.  He has never used his oxycodone that was prescribed.  He has asked for a refill of his Robaxin which I think is appropriate.  I will send in Robaxin 1000mg  q 8 hrs prn #40.  We discussed that if he continues to have further needs or new symptoms such as worsening chest pain, SOB, etc, he will need to see his PCP.  He understands.  He also knows he can call if he has any questions or concerns.  No further trauma clinic follow up needed.  [place] 4:34 PM 04/21/2021

## 2021-05-01 DIAGNOSIS — S2242XD Multiple fractures of ribs, left side, subsequent encounter for fracture with routine healing: Secondary | ICD-10-CM | POA: Diagnosis not present

## 2021-08-17 DIAGNOSIS — E1165 Type 2 diabetes mellitus with hyperglycemia: Secondary | ICD-10-CM | POA: Diagnosis not present

## 2021-08-17 DIAGNOSIS — E782 Mixed hyperlipidemia: Secondary | ICD-10-CM | POA: Diagnosis not present

## 2021-08-24 DIAGNOSIS — M1711 Unilateral primary osteoarthritis, right knee: Secondary | ICD-10-CM | POA: Diagnosis not present

## 2021-08-24 DIAGNOSIS — I10 Essential (primary) hypertension: Secondary | ICD-10-CM | POA: Diagnosis not present

## 2021-08-24 DIAGNOSIS — E1165 Type 2 diabetes mellitus with hyperglycemia: Secondary | ICD-10-CM | POA: Diagnosis not present

## 2021-08-24 DIAGNOSIS — R809 Proteinuria, unspecified: Secondary | ICD-10-CM | POA: Diagnosis not present

## 2021-08-24 DIAGNOSIS — E782 Mixed hyperlipidemia: Secondary | ICD-10-CM | POA: Diagnosis not present

## 2021-08-24 DIAGNOSIS — Z0001 Encounter for general adult medical examination with abnormal findings: Secondary | ICD-10-CM | POA: Diagnosis not present

## 2021-08-24 DIAGNOSIS — D696 Thrombocytopenia, unspecified: Secondary | ICD-10-CM | POA: Diagnosis not present

## 2021-08-24 DIAGNOSIS — M1990 Unspecified osteoarthritis, unspecified site: Secondary | ICD-10-CM | POA: Diagnosis not present

## 2021-12-02 DIAGNOSIS — E782 Mixed hyperlipidemia: Secondary | ICD-10-CM | POA: Diagnosis not present

## 2021-12-02 DIAGNOSIS — E1165 Type 2 diabetes mellitus with hyperglycemia: Secondary | ICD-10-CM | POA: Diagnosis not present

## 2021-12-07 DIAGNOSIS — I10 Essential (primary) hypertension: Secondary | ICD-10-CM | POA: Diagnosis not present

## 2021-12-07 DIAGNOSIS — E782 Mixed hyperlipidemia: Secondary | ICD-10-CM | POA: Diagnosis not present

## 2021-12-07 DIAGNOSIS — Z6837 Body mass index (BMI) 37.0-37.9, adult: Secondary | ICD-10-CM | POA: Diagnosis not present

## 2021-12-07 DIAGNOSIS — R809 Proteinuria, unspecified: Secondary | ICD-10-CM | POA: Diagnosis not present

## 2021-12-07 DIAGNOSIS — E1169 Type 2 diabetes mellitus with other specified complication: Secondary | ICD-10-CM | POA: Diagnosis not present

## 2021-12-07 DIAGNOSIS — M1711 Unilateral primary osteoarthritis, right knee: Secondary | ICD-10-CM | POA: Diagnosis not present

## 2021-12-07 DIAGNOSIS — M25572 Pain in left ankle and joints of left foot: Secondary | ICD-10-CM | POA: Diagnosis not present

## 2021-12-07 DIAGNOSIS — D696 Thrombocytopenia, unspecified: Secondary | ICD-10-CM | POA: Diagnosis not present

## 2022-03-31 DIAGNOSIS — E1169 Type 2 diabetes mellitus with other specified complication: Secondary | ICD-10-CM | POA: Diagnosis not present

## 2022-03-31 DIAGNOSIS — E782 Mixed hyperlipidemia: Secondary | ICD-10-CM | POA: Diagnosis not present

## 2022-03-31 DIAGNOSIS — M25572 Pain in left ankle and joints of left foot: Secondary | ICD-10-CM | POA: Diagnosis not present

## 2022-04-07 DIAGNOSIS — E1169 Type 2 diabetes mellitus with other specified complication: Secondary | ICD-10-CM | POA: Diagnosis not present

## 2022-04-07 DIAGNOSIS — Z6839 Body mass index (BMI) 39.0-39.9, adult: Secondary | ICD-10-CM | POA: Diagnosis not present

## 2022-04-07 DIAGNOSIS — I10 Essential (primary) hypertension: Secondary | ICD-10-CM | POA: Diagnosis not present

## 2022-04-07 DIAGNOSIS — E782 Mixed hyperlipidemia: Secondary | ICD-10-CM | POA: Diagnosis not present

## 2022-04-07 DIAGNOSIS — R809 Proteinuria, unspecified: Secondary | ICD-10-CM | POA: Diagnosis not present

## 2022-04-07 DIAGNOSIS — M25572 Pain in left ankle and joints of left foot: Secondary | ICD-10-CM | POA: Diagnosis not present

## 2022-04-07 DIAGNOSIS — D696 Thrombocytopenia, unspecified: Secondary | ICD-10-CM | POA: Diagnosis not present

## 2022-04-07 DIAGNOSIS — M1711 Unilateral primary osteoarthritis, right knee: Secondary | ICD-10-CM | POA: Diagnosis not present

## 2022-07-23 DIAGNOSIS — E782 Mixed hyperlipidemia: Secondary | ICD-10-CM | POA: Diagnosis not present

## 2022-07-23 DIAGNOSIS — E1169 Type 2 diabetes mellitus with other specified complication: Secondary | ICD-10-CM | POA: Diagnosis not present

## 2022-08-04 DIAGNOSIS — R809 Proteinuria, unspecified: Secondary | ICD-10-CM | POA: Diagnosis not present

## 2022-08-04 DIAGNOSIS — Z125 Encounter for screening for malignant neoplasm of prostate: Secondary | ICD-10-CM | POA: Diagnosis not present

## 2022-08-04 DIAGNOSIS — E1169 Type 2 diabetes mellitus with other specified complication: Secondary | ICD-10-CM | POA: Diagnosis not present

## 2022-08-04 DIAGNOSIS — I10 Essential (primary) hypertension: Secondary | ICD-10-CM | POA: Diagnosis not present

## 2022-08-04 DIAGNOSIS — M25572 Pain in left ankle and joints of left foot: Secondary | ICD-10-CM | POA: Diagnosis not present

## 2022-08-04 DIAGNOSIS — Z1211 Encounter for screening for malignant neoplasm of colon: Secondary | ICD-10-CM | POA: Diagnosis not present

## 2022-08-04 DIAGNOSIS — D696 Thrombocytopenia, unspecified: Secondary | ICD-10-CM | POA: Diagnosis not present

## 2022-08-04 DIAGNOSIS — E782 Mixed hyperlipidemia: Secondary | ICD-10-CM | POA: Diagnosis not present

## 2022-08-04 DIAGNOSIS — G4700 Insomnia, unspecified: Secondary | ICD-10-CM | POA: Diagnosis not present

## 2022-08-04 DIAGNOSIS — M1711 Unilateral primary osteoarthritis, right knee: Secondary | ICD-10-CM | POA: Diagnosis not present

## 2022-08-23 DIAGNOSIS — D696 Thrombocytopenia, unspecified: Secondary | ICD-10-CM | POA: Diagnosis not present

## 2022-11-24 DIAGNOSIS — Z125 Encounter for screening for malignant neoplasm of prostate: Secondary | ICD-10-CM | POA: Diagnosis not present

## 2022-11-24 DIAGNOSIS — E1169 Type 2 diabetes mellitus with other specified complication: Secondary | ICD-10-CM | POA: Diagnosis not present

## 2022-11-24 DIAGNOSIS — E782 Mixed hyperlipidemia: Secondary | ICD-10-CM | POA: Diagnosis not present

## 2022-11-29 DIAGNOSIS — E1169 Type 2 diabetes mellitus with other specified complication: Secondary | ICD-10-CM | POA: Diagnosis not present

## 2022-11-29 DIAGNOSIS — Z79899 Other long term (current) drug therapy: Secondary | ICD-10-CM | POA: Diagnosis not present

## 2022-11-29 DIAGNOSIS — M25572 Pain in left ankle and joints of left foot: Secondary | ICD-10-CM | POA: Diagnosis not present

## 2022-11-29 DIAGNOSIS — Z0001 Encounter for general adult medical examination with abnormal findings: Secondary | ICD-10-CM | POA: Diagnosis not present

## 2022-11-29 DIAGNOSIS — M1711 Unilateral primary osteoarthritis, right knee: Secondary | ICD-10-CM | POA: Diagnosis not present

## 2022-11-29 DIAGNOSIS — G4700 Insomnia, unspecified: Secondary | ICD-10-CM | POA: Diagnosis not present

## 2022-11-29 DIAGNOSIS — D696 Thrombocytopenia, unspecified: Secondary | ICD-10-CM | POA: Diagnosis not present

## 2022-11-29 DIAGNOSIS — E782 Mixed hyperlipidemia: Secondary | ICD-10-CM | POA: Diagnosis not present

## 2022-11-29 DIAGNOSIS — I10 Essential (primary) hypertension: Secondary | ICD-10-CM | POA: Diagnosis not present

## 2022-11-29 DIAGNOSIS — R809 Proteinuria, unspecified: Secondary | ICD-10-CM | POA: Diagnosis not present

## 2022-11-29 DIAGNOSIS — Z791 Long term (current) use of non-steroidal anti-inflammatories (NSAID): Secondary | ICD-10-CM | POA: Diagnosis not present

## 2023-01-04 IMAGING — CT CT CERVICAL SPINE W/O CM
3 series · 12 of 33 positions shown, 14 images · non-contrast
Comparison: None.

CLINICAL DATA: Polytrauma, critical, head/C-spine injury suspected.
Fall from ladder.

EXAM:
CT CERVICAL SPINE WITHOUT CONTRAST
TECHNIQUE: Multidetector CT imaging of the cervical spine was performed without
intravenous contrast. Multiplanar CT image reconstructions were also
generated.

[Series 4: c spine soft · axial · 0.48mm/px · z∈[-0,+146]mm · 4 of 107 slices shown, 5 images]
[im 17/107  soft-tissue]
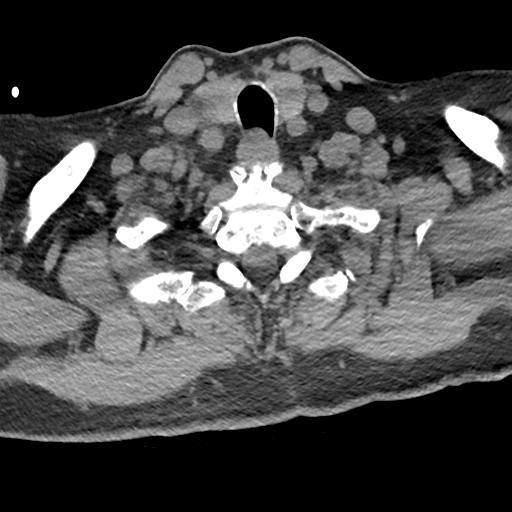
[im 17/107  bone]
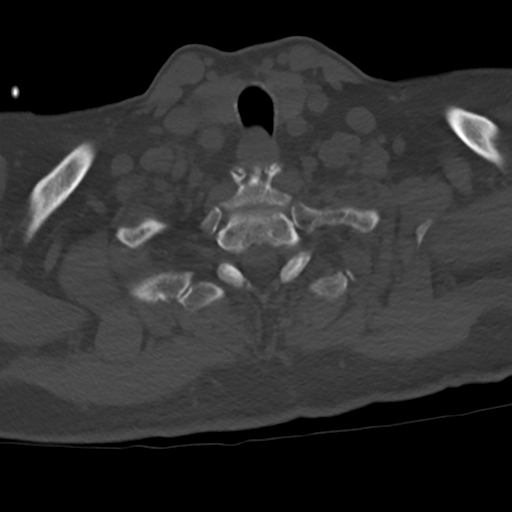
[im 41/107  bone]
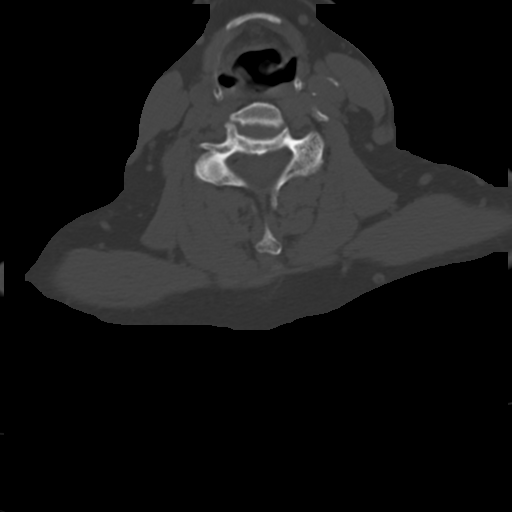
[im 66/107  bone]
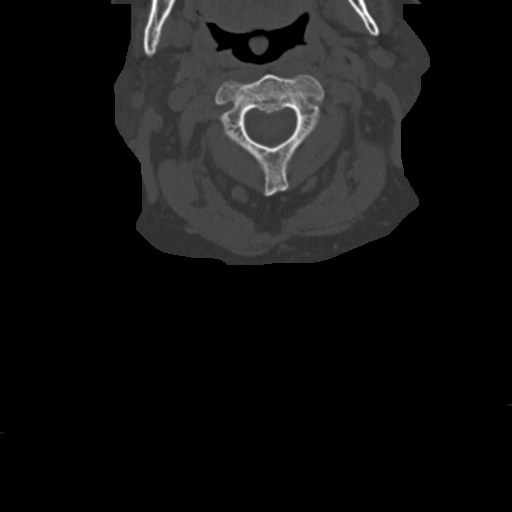
[im 90/107  bone]
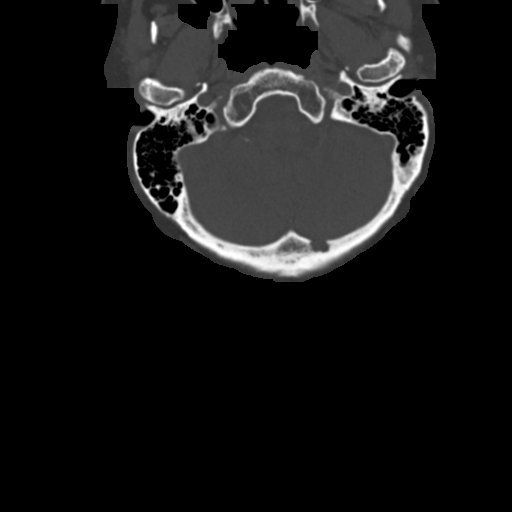

[Series 5: sagittal bone · sagittal · 0.38mm/px · 5 of 65 slices shown, 6 images]
[im 22/65  bone]
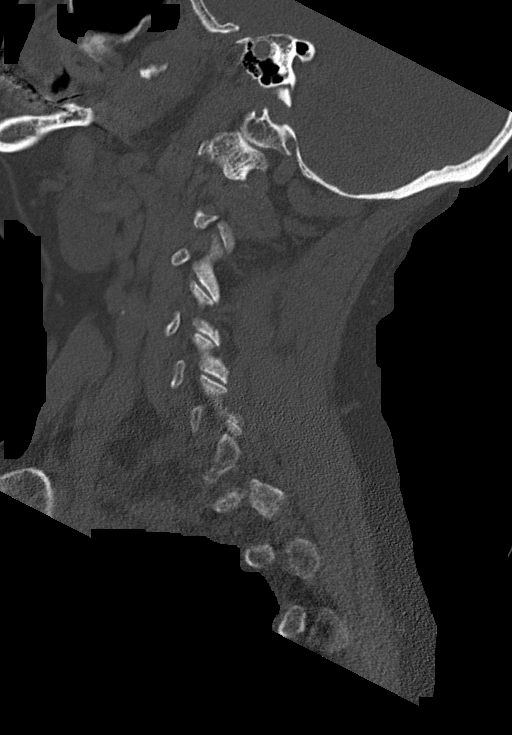
[im 27/65  bone]
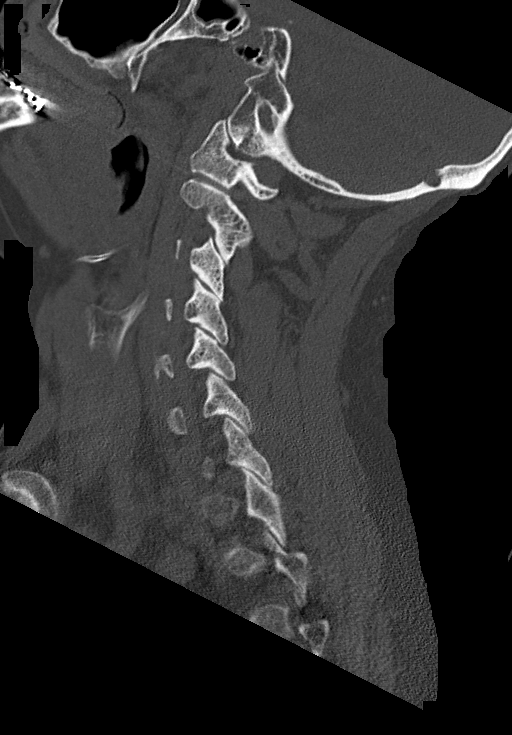
[im 33/65  soft-tissue]
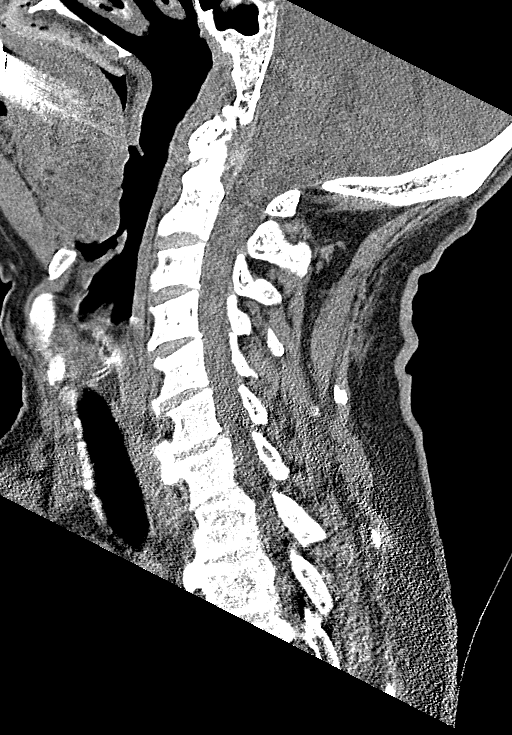
[im 33/65  bone]
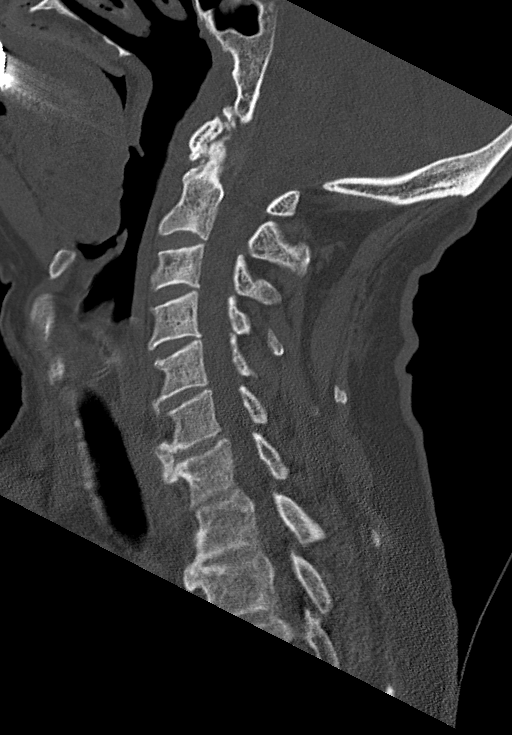
[im 38/65  bone]
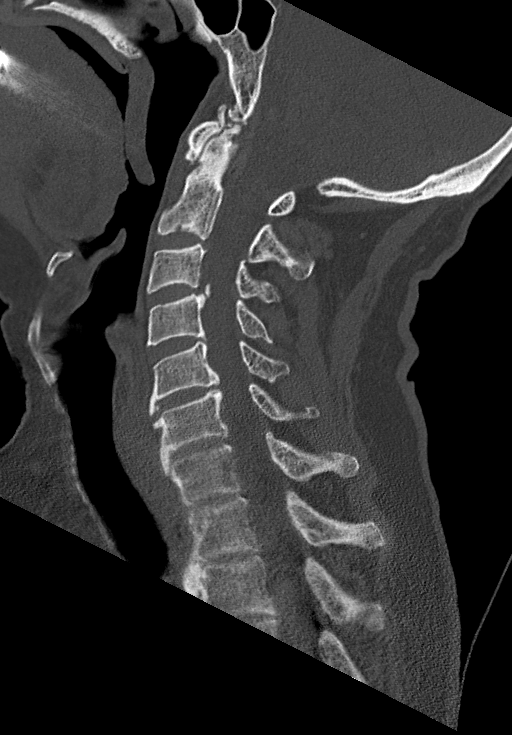
[im 43/65  bone]
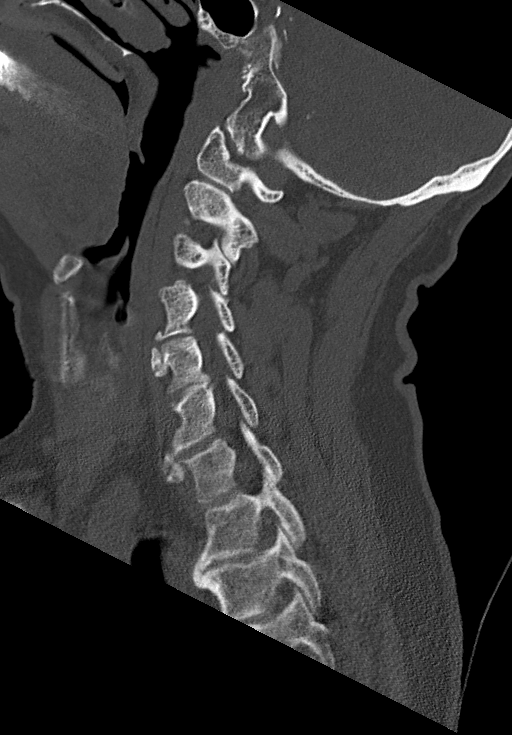

[Series 6: coronal bone · coronal · 0.25mm/px · 3 of 97 slices shown]
[im 32/97  bone]
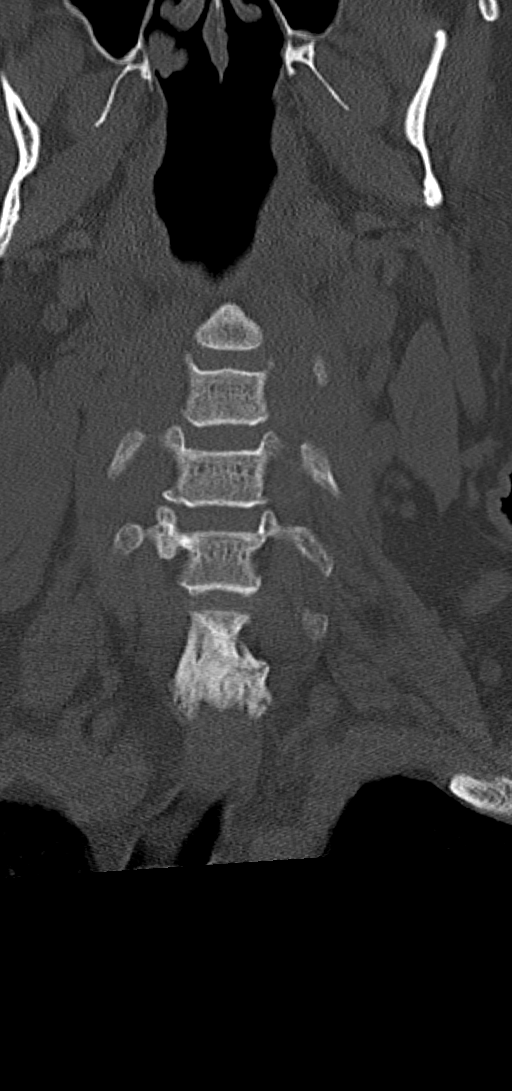
[im 43/97  bone]
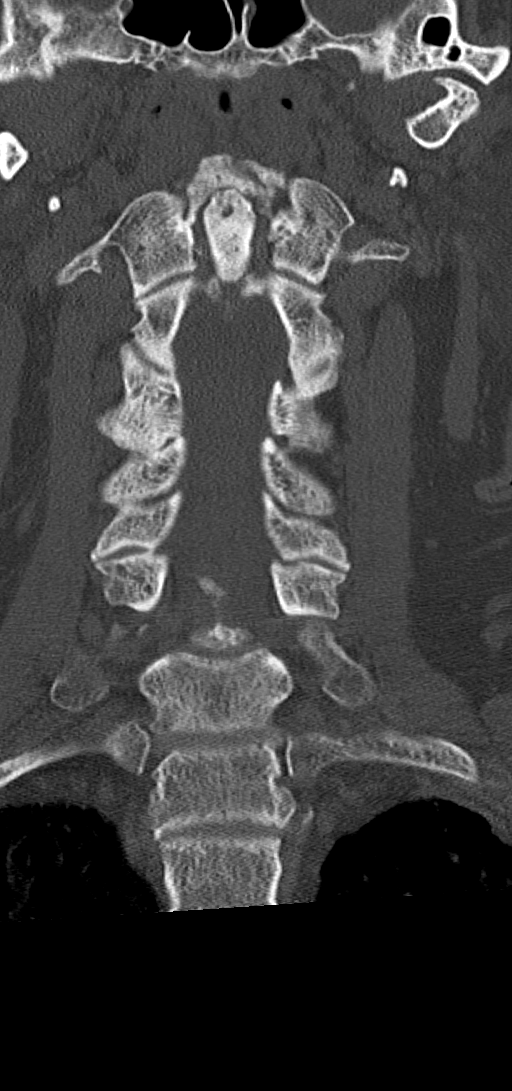
[im 54/97  bone]
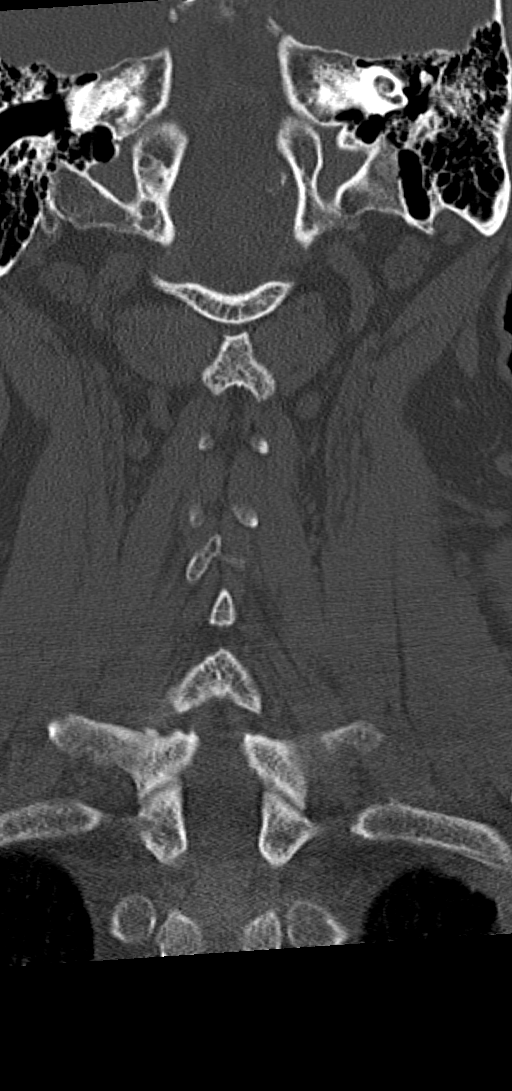

[12 of 33 positions shown; findings below may reference images not displayed]

FINDINGS: Alignment: Normal

Skull base and vertebrae: No acute fracture. No primary bone lesion
or focal pathologic process.

Soft tissues and spinal canal: No prevertebral fluid or swelling. No
visible canal hematoma.

Disc levels: Early degenerative spurring both anteriorly and
posteriorly throughout the cervical spine. Disc space narrowing in
the lower cervical spine. Early degenerative facet disease
bilaterally.

Upper chest: Left apical pneumothorax. This is better seen and
discussed on chest CT report.

Other: None
IMPRESSION: No acute bony abnormality in the cervical spine.

Left pneumothorax.  See chest CT report for further discussion.

## 2023-05-02 ENCOUNTER — Ambulatory Visit: Payer: PPO

## 2023-05-02 ENCOUNTER — Ambulatory Visit
Admission: EM | Admit: 2023-05-02 | Discharge: 2023-05-02 | Disposition: A | Discharge status: Home / Self Care | Payer: PPO | Attending: Nurse Practitioner | Admitting: Nurse Practitioner

## 2023-05-02 DIAGNOSIS — R062 Wheezing: Secondary | ICD-10-CM | POA: Diagnosis not present

## 2023-05-02 DIAGNOSIS — J22 Unspecified acute lower respiratory infection: Secondary | ICD-10-CM | POA: Diagnosis not present

## 2023-05-02 DIAGNOSIS — J069 Acute upper respiratory infection, unspecified: Secondary | ICD-10-CM | POA: Diagnosis not present

## 2023-05-02 DIAGNOSIS — Z1152 Encounter for screening for COVID-19: Secondary | ICD-10-CM | POA: Diagnosis not present

## 2023-05-02 DIAGNOSIS — R059 Cough, unspecified: Secondary | ICD-10-CM | POA: Diagnosis not present

## 2023-05-02 MED ORDER — IPRATROPIUM-ALBUTEROL 0.5-2.5 (3) MG/3ML IN SOLN
3.0000 mL | Freq: Once | RESPIRATORY_TRACT | Status: AC
  Administered 2023-05-02: 3 mL via RESPIRATORY_TRACT

## 2023-05-02 MED ORDER — GUAIFENESIN 100 MG/5ML PO LIQD: 10.0000 mL | Freq: Three times a day (TID) | ORAL | 0 refills | Status: DC | PRN

## 2023-05-02 MED ORDER — METHYLPREDNISOLONE SODIUM SUCC 125 MG IJ SOLR
80.0000 mg | Freq: Once | INTRAMUSCULAR | Status: AC
  Administered 2023-05-02: 80 mg via INTRAMUSCULAR

## 2023-05-02 MED ORDER — ALBUTEROL SULFATE HFA 108 (90 BASE) MCG/ACT IN AERS: 2.0000 | INHALATION_SPRAY | Freq: Four times a day (QID) | RESPIRATORY_TRACT | 2 refills | Status: AC | PRN

## 2023-05-02 MED ORDER — PREDNISONE 20 MG PO TABS: 40.0000 mg | ORAL_TABLET | Freq: Every day | ORAL | 0 refills | Status: AC

## 2023-05-02 MED ORDER — PROMETHAZINE-DM 6.25-15 MG/5ML PO SYRP: 5.0000 mL | ORAL_SOLUTION | Freq: Four times a day (QID) | ORAL | 0 refills | Status: AC | PRN

## 2023-05-02 MED ORDER — AMOXICILLIN-POT CLAVULANATE 875-125 MG PO TABS: 1.0000 | ORAL_TABLET | Freq: Two times a day (BID) | ORAL | 0 refills | Status: AC

## 2023-05-02 MED ORDER — FLUTICASONE PROPIONATE 50 MCG/ACT NA SUSP: 2.0000 | Freq: Every day | NASAL | 0 refills | Status: DC

## 2023-05-02 NOTE — Discharge Instructions (Addendum)
The chest x-ray did not show pneumonia. COVID test is pending.  You will be contacted if the pending test result is abnormal. Take medication as prescribed.  Recommend over-the-counter Coricidin HBP to take during the daytime to help with your cough. May take over-the-counter Tylenol as needed for pain, fever, or general discomfort. Increase fluids and allow for plenty of rest. Recommend using a humidifier in your bedroom at nighttime during sleep and sleeping elevated on pillows while cough symptoms persist. If symptoms are not improving over the next 7 to 10 days, please follow-up with your primary care physician for further evaluation. Go to the emergency department immediately if you experience worsening wheezing, shortness of breath, difficulty breathing, or other concerns. Follow-up as needed.

## 2023-05-02 NOTE — ED Provider Notes (Addendum)
[facility]    CSN: [id number] Arrival date & time: 05/02/23  7829      History   Chief Complaint No chief complaint on file.   HPI Phillip Hayes is a 72 y.o. male.   The history is provided by the patient.   The patient presents with a 2 to 3-day history of subjective fever, chills, body aches, headaches, nasal congestion, ear fullness, cough, shortness of breath, and wheezing.  He also complains of a "burning pain" between his shoulder blades when he is breathing.  Patient denies ear drainage, chest pain, abdominal pain, nausea, vomiting, or diarrhea.  Patient reports that his wife was sick with a sinus infection earlier last week.  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  Patient reports he has been taking Alka-Seltzer for his symptoms with minimal relief.  Past Medical History:  Diagnosis Date   Dorsalgia, unspecified    Essential (primary) hypertension    Gout, unspecified    Hypertension    Type 2 diabetes mellitus without complications (HCC)    Unspecified osteoarthritis, unspecified site     Patient Active Problem List   Diagnosis Date Noted   Pneumothorax 04/10/2021   Hyperlipidemia 09/24/2019   Thrombocytopenia (HCC) 09/24/2019   Dorsalgia, unspecified    Essential (primary) hypertension    Gout, unspecified    Type 2 diabetes mellitus without complications (HCC)    Unspecified osteoarthritis, unspecified site     Past Surgical History:  Procedure Laterality Date   none         Home Medications    Prior to Admission medications   Medication Sig Start Date End Date Taking? Authorizing Provider  albuterol (VENTOLIN HFA) 108 (90 Base) MCG/ACT inhaler Inhale 2 puffs into the lungs every 6 (six) hours as needed for wheezing or shortness of breath. 05/02/23  Yes Prakash Kimberling-Warren, Sadie Haber, NP  amoxicillin-clavulanate (AUGMENTIN) 875-125 MG tablet Take 1 tablet by mouth every 12  (twelve) hours. 05/02/23  Yes Laronica Bhagat-Warren, Sadie Haber, NP  predniSONE (DELTASONE) 20 MG tablet Take 2 tablets (40 mg total) by mouth daily with breakfast for 5 days. 05/02/23 05/07/23 Yes Abram Sax-Warren, Sadie Haber, NP  promethazine-dextromethorphan (PROMETHAZINE-DM) 6.25-15 MG/5ML syrup Take 5 mLs by mouth 4 (four) times daily as needed. 05/02/23  Yes Yosgart Pavey-Warren, Sadie Haber, NP  acetaminophen (TYLENOL) 500 MG tablet Take 2 tablets (1,000 mg total) by mouth every 6 (six) hours as needed. 04/12/21   Barnetta Chapel, PA-C  amLODipine (NORVASC) 10 MG tablet Take 1 tablet (10 mg total) by mouth daily. 12/18/19   Corum, Minerva Fester, MD  atenolol (TENORMIN) 50 MG tablet Take 50 mg by mouth daily. 09/15/17   [provider]  diclofenac (VOLTAREN) 75 MG EC tablet Take 1 tablet (75 mg total) by mouth 2 (two) times daily. Patient taking differently: Take 75 mg by mouth daily. 11/26/19   Corum, Minerva Fester, MD  methocarbamol (ROBAXIN) 500 MG tablet Take 2 tablets (1,000 mg total) by mouth every 8 (eight) hours as needed for muscle spasms. 04/12/21   Barnetta Chapel, PA-C  olmesartan-hydrochlorothiazide (BENICAR HCT) 40-12.5 MG tablet Take 1 tablet by mouth daily.    [provider]  oxyCODONE (OXY IR/ROXICODONE) 5 MG immediate release tablet Take 1 tablet (5 mg total) by mouth every 6 (six) hours as needed for moderate pain. 04/12/21   Barnetta Chapel, PA-C  rosuvastatin (CRESTOR) 20 MG tablet Take 20 mg by  mouth See admin instructions. Take 1 tablet by mouth on Monday,Wednesday, and Friday    [provider]    Family History Family History  Problem Relation Age of Onset   Healthy Mother    Heart disease Mother    Stroke Mother    Healthy Father     Social History Social History   Tobacco Use   Smoking status: Never   Smokeless tobacco: Never  Substance Use Topics   Alcohol use: No   Drug use: No     Allergies   Patient has no known allergies.   Review of Systems Review of  Systems Per HPI  Physical Exam Triage Vital Signs ED Triage Vitals [05/02/23 1101]  Encounter Vitals Group     BP (!) 167/81     Systolic BP Percentile      Diastolic BP Percentile      Pulse Rate 70     Resp 13     Temp 99 F (37.2 C)     Temp Source Oral     SpO2 98 %     Weight      Height      Head Circumference      Peak Flow      Pain Score      Pain Loc      Pain Education      Exclude from Growth Chart    No data found.  Updated Vital Signs BP (!) 167/81 (BP Location: Right Arm)   Pulse 70   Temp 99 F (37.2 C) (Oral)   Resp 13   SpO2 98%   Visual Acuity Right Eye Distance:   Left Eye Distance:   Bilateral Distance:    Right Eye Near:   Left Eye Near:    Bilateral Near:     Physical Exam Vitals and nursing note reviewed.  Constitutional:      General: He is not in acute distress.    Appearance: Normal appearance. He is well-developed.  HENT:     Head: Normocephalic and atraumatic.     Right Ear: Tympanic membrane, ear canal and external ear normal.     Left Ear: Tympanic membrane, ear canal and external ear normal.     Nose: Congestion and rhinorrhea present. Rhinorrhea is clear.     Mouth/Throat:     Lips: Pink.     Mouth: Mucous membranes are moist.     Pharynx: Oropharynx is clear. Uvula midline. Posterior oropharyngeal erythema and postnasal drip present. No pharyngeal swelling, oropharyngeal exudate or uvula swelling.     Tonsils: No tonsillar exudate or tonsillar abscesses.     Comments: Cobblestoning present to posterior oropharynx Eyes:     Extraocular Movements: Extraocular movements intact.     Conjunctiva/sclera: Conjunctivae normal.     Pupils: Pupils are equal, round, and reactive to light.  Neck:     Thyroid: No thyromegaly.     Trachea: No tracheal deviation.  Cardiovascular:     Rate and Rhythm: Normal rate and regular rhythm.     Pulses: Normal pulses.     Heart sounds: Normal heart sounds.  Pulmonary:     Effort:  Pulmonary effort is normal.     Breath sounds: Examination of the right-upper field reveals wheezing and rhonchi. Examination of the left-upper field reveals wheezing and rhonchi. Examination of the right-lower field reveals wheezing and rhonchi. Examination of the left-lower field reveals wheezing and rhonchi. Wheezing and rhonchi present.  Abdominal:  General: Bowel sounds are normal.     Palpations: Abdomen is soft.  Musculoskeletal:     Cervical back: Normal range of motion and neck supple.  Skin:    General: Skin is warm and dry.  Neurological:     General: No focal deficit present.     Mental Status: He is alert and oriented to person, place, and time.  Psychiatric:        Mood and Affect: Mood normal.        Behavior: Behavior normal.        Thought Content: Thought content normal.        Judgment: Judgment normal.      UC Treatments / Results  Labs (all labs ordered are listed, but only abnormal results are displayed) Labs Reviewed  SARS CORONAVIRUS 2 (TAT 6-24 HRS)     EKG   Radiology DG Chest 2 View  Result Date: 05/02/2023 CLINICAL DATA:  Cough, wheezing. EXAM: CHEST - 2 VIEW COMPARISON:  April 21, 2021. FINDINGS: The heart size and mediastinal contours are within normal limits. Both lungs are clear. The visualized skeletal structures are unremarkable. IMPRESSION: No active cardiopulmonary disease. Electronically Signed   By: Lupita Raider M.D.   On: 05/02/2023 12:03    Procedures Procedures (including critical care time)  Medications Ordered in UC Medications  ipratropium-albuterol (DUONEB) 0.5-2.5 (3) MG/3ML nebulizer solution 3 mL (3 mLs Nebulization Given 05/02/23 1148)  methylPREDNISolone sodium succinate (SOLU-MEDROL) 125 mg/2 mL injection 80 mg (80 mg Intramuscular Given 05/02/23 1148)    Initial Impression / Assessment and Plan / UC Course  I have reviewed the triage vital signs and the nursing notes.  Pertinent labs & imaging results that were  available during my care of the patient were reviewed by me and considered in my medical decision making (see chart for details).  The patient is well-appearing, he is in no acute distress, he is mildly hypertensive, but vital signs are otherwise stable.  COVID test is pending.  Patient is a candidate to receive Paxlovid renal dosing if his COVID test is positive, he will need to hold the Crestor for 2 weeks.  Chest x-ray was negative for active cardiopulmonary disease.  Symptoms appear to be a viral etiology; however, will treat prophylactically for bacterial infection with Augmentin 875/125 mg tablets.  For his cough, prednisone 40 mg was prescribed along with Promethazine DM for nighttime cough.  Patient was also prescribed an albuterol inhaler to help with wheezing and shortness of breath.  Supportive care recommendations were provided and discussed with the patient to include increasing fluids, allowing for plenty of rest, over-the-counter analgesics, and use of a humidifier at nighttime.  Patient was given indications of when to follow-up with his primary care physician and when to go to the emergency department.  Patient is in agreement with this plan of care and verbalizes understanding.  All questions were answered.  Patient stable for discharge.  Final Clinical Impressions(s) / UC Diagnoses   Final diagnoses:  Lower respiratory infection  Cough, unspecified type  Encounter for screening for COVID-19     Discharge Instructions      The chest x-ray did not show pneumonia. COVID test is pending.  You will be contacted if the pending test result is abnormal. Take medication as prescribed.  Recommend over-the-counter Coricidin HBP to take during the daytime to help with your cough. May take over-the-counter Tylenol as needed for pain, fever, or general discomfort. Increase fluids and allow for  plenty of rest. Recommend using a humidifier in your bedroom at nighttime during sleep and  sleeping elevated on pillows while cough symptoms persist. If symptoms are not improving over the next 7 to 10 days, please follow-up with your primary care physician for further evaluation. Go to the emergency department immediately if you experience worsening wheezing, shortness of breath, difficulty breathing, or other concerns. Follow-up as needed.      ED Prescriptions     Medication Sig Dispense Auth. Provider   guaiFENesin (ROBITUSSIN) 100 MG/5ML liquid  (Status: Discontinued) Take 10 mLs by mouth every 8 (eight) hours as needed for up to 10 days for cough or to loosen phlegm. 300 mL Martavion Couper-Warren, Sadie Haber, NP   fluticasone (FLONASE) 50 MCG/ACT nasal spray  (Status: Discontinued) Place 2 sprays into both nostrils daily. 16 g Nyari Olsson-Warren, Sadie Haber, NP   amoxicillin-clavulanate (AUGMENTIN) 875-125 MG tablet Take 1 tablet by mouth every 12 (twelve) hours. 14 tablet Myrle Wanek-Warren, Sadie Haber, NP   predniSONE (DELTASONE) 20 MG tablet Take 2 tablets (40 mg total) by mouth daily with breakfast for 5 days. 10 tablet Prospero Mahnke-Warren, Sadie Haber, NP   albuterol (VENTOLIN HFA) 108 (90 Base) MCG/ACT inhaler Inhale 2 puffs into the lungs every 6 (six) hours as needed for wheezing or shortness of breath. 8 g Azaya Goedde-Warren, Sadie Haber, NP   promethazine-dextromethorphan (PROMETHAZINE-DM) 6.25-15 MG/5ML syrup Take 5 mLs by mouth 4 (four) times daily as needed. 118 mL Mackinzee Roszak-Warren, Sadie Haber, NP      PDMP not reviewed this encounter.   Abran Cantor, NP 05/02/23 1110    Aliha Diedrich-Warren, Sadie Haber, NP 05/02/23 [phone]

## 2023-05-02 NOTE — ED Triage Notes (Addendum)
Pt c/o cough, ear fullness nasal congestion, chest congestion, headache , chills, body ache. Pain in between shoulder blades when breathing. X 3 days

## 2023-05-03 LAB — SARS CORONAVIRUS 2 (TAT 6-24 HRS): SARS Coronavirus 2: POSITIVE — AB

## 2023-05-27 DIAGNOSIS — E782 Mixed hyperlipidemia: Secondary | ICD-10-CM | POA: Diagnosis not present

## 2023-05-27 DIAGNOSIS — E1169 Type 2 diabetes mellitus with other specified complication: Secondary | ICD-10-CM | POA: Diagnosis not present

## 2023-06-01 DIAGNOSIS — E1169 Type 2 diabetes mellitus with other specified complication: Secondary | ICD-10-CM | POA: Diagnosis not present

## 2023-06-01 DIAGNOSIS — G4700 Insomnia, unspecified: Secondary | ICD-10-CM | POA: Diagnosis not present

## 2023-06-01 DIAGNOSIS — M1711 Unilateral primary osteoarthritis, right knee: Secondary | ICD-10-CM | POA: Diagnosis not present

## 2023-06-01 DIAGNOSIS — E782 Mixed hyperlipidemia: Secondary | ICD-10-CM | POA: Diagnosis not present

## 2023-06-01 DIAGNOSIS — Z1211 Encounter for screening for malignant neoplasm of colon: Secondary | ICD-10-CM | POA: Diagnosis not present

## 2023-06-01 DIAGNOSIS — I10 Essential (primary) hypertension: Secondary | ICD-10-CM | POA: Diagnosis not present

## 2023-06-01 DIAGNOSIS — Z23 Encounter for immunization: Secondary | ICD-10-CM | POA: Diagnosis not present

## 2023-06-01 DIAGNOSIS — D696 Thrombocytopenia, unspecified: Secondary | ICD-10-CM | POA: Diagnosis not present

## 2023-06-01 DIAGNOSIS — M25572 Pain in left ankle and joints of left foot: Secondary | ICD-10-CM | POA: Diagnosis not present

## 2023-06-01 DIAGNOSIS — R809 Proteinuria, unspecified: Secondary | ICD-10-CM | POA: Diagnosis not present

## 2023-06-09 ENCOUNTER — Encounter: Payer: Self-pay | Admitting: *Deleted

## 2023-09-27 DIAGNOSIS — E1169 Type 2 diabetes mellitus with other specified complication: Secondary | ICD-10-CM | POA: Diagnosis not present

## 2023-09-27 DIAGNOSIS — E782 Mixed hyperlipidemia: Secondary | ICD-10-CM | POA: Diagnosis not present

## 2023-10-03 DIAGNOSIS — G4700 Insomnia, unspecified: Secondary | ICD-10-CM | POA: Diagnosis not present

## 2023-10-03 DIAGNOSIS — R944 Abnormal results of kidney function studies: Secondary | ICD-10-CM | POA: Diagnosis not present

## 2023-10-03 DIAGNOSIS — M1711 Unilateral primary osteoarthritis, right knee: Secondary | ICD-10-CM | POA: Diagnosis not present

## 2023-10-03 DIAGNOSIS — M25572 Pain in left ankle and joints of left foot: Secondary | ICD-10-CM | POA: Diagnosis not present

## 2023-10-03 DIAGNOSIS — D696 Thrombocytopenia, unspecified: Secondary | ICD-10-CM | POA: Diagnosis not present

## 2023-10-03 DIAGNOSIS — E1169 Type 2 diabetes mellitus with other specified complication: Secondary | ICD-10-CM | POA: Diagnosis not present

## 2023-10-03 DIAGNOSIS — Z1211 Encounter for screening for malignant neoplasm of colon: Secondary | ICD-10-CM | POA: Diagnosis not present

## 2023-10-03 DIAGNOSIS — I10 Essential (primary) hypertension: Secondary | ICD-10-CM | POA: Diagnosis not present

## 2023-10-03 DIAGNOSIS — K121 Other forms of stomatitis: Secondary | ICD-10-CM | POA: Diagnosis not present

## 2023-10-03 DIAGNOSIS — R809 Proteinuria, unspecified: Secondary | ICD-10-CM | POA: Diagnosis not present

## 2023-10-03 DIAGNOSIS — E782 Mixed hyperlipidemia: Secondary | ICD-10-CM | POA: Diagnosis not present

## 2023-10-19 DIAGNOSIS — R944 Abnormal results of kidney function studies: Secondary | ICD-10-CM | POA: Diagnosis not present

## 2023-11-18 ENCOUNTER — Telehealth: Payer: Self-pay

## 2023-11-18 NOTE — Progress Notes (Signed)
   11/18/2023  Patient ID: Phillip Hayes, male   DOB: May 29, 1951, 73 y.o.   MRN: [id number]   Patient appeared on insurance report for not passing the quality metrics in 2024:  Medication Adherence for Cholesterol (MAC) Medication Adherence for Diabetes (MAD)   Outreach to the patient was not needed today.  Meds Tracking:  -Farxiga 10 mg - No fills yet this year, A1C well controlled at 6.2%, next fill due 11/28/23  -Olmesartan/hydrochlorothiazide - No fills this year, BP 138/88 on 10/03/23. Possibly overdue for all BP meds, all were filled last for 90DS on 05/26/23  -Rosuvastatin 20 mg - Last filled 90DS 10/03/23, LDL 101 on 09/27/23 but non-adherent prior, next fill due 01/01/24  Plan:  Next visit with PCP is 02/03/24, BP med refills will expire on 11/28/23 so will schedule outreach on 11/24/23 to see if refills are needed or when he will expect to run out of BP meds and Farxiga.   Fayette Pho, PharmD

## 2023-12-08 ENCOUNTER — Encounter: Payer: Self-pay | Admitting: *Deleted

## 2024-01-05 ENCOUNTER — Telehealth: Payer: Self-pay

## 2024-01-05 NOTE — Progress Notes (Signed)
   01/05/2024  Patient ID: Phillip Hayes, male   DOB: 07/07/1951, 73 y.o.   MRN: [id number]   Patient appeared on insurance report for not passing the quality metrics in 2024:  Medication Adherence for Cholesterol (MAC) Medication Adherence for Diabetes (MAD)   Outreach to the patient was successful.  Meds Tracking:  -Farxiga 10 mg - Last fill [facility] on 11/18/23, A1C well controlled at 6.2%. Does not qualify for metric yet, next fill due 02/16/24  -Olmesartan /hydrochlorothiazide  - No fills this year, BP 138/88 on 10/03/23. Possibly overdue for all BP meds, all were filled last for [facility] on 05/26/23. He says he has plenty, he just filled up his monthly organizer of medication and confirmed olmesartan  was present in [employer].. Did not have a bottle available so he couldn't tell me how much he had left. He thinks he threw it away. If he is correct he should run out by the end of the month.  -Rosuvastatin 20 mg - Last filled [facility] 01/04/24, LDL 101 on 09/27/23 but non-adherent prior, next fill due 04/03/24. Plans to go pick this up later today  Plan:  Next visit with PCP is 02/03/24, all his BP meds were last filled 05/25/24 and he's certain he's been taking them all. Since he was unable to determine exactly how much BP medication he had left I instructed him to contact me when he finishes his current supply. I will schedule a fill history review on 02/20/24 to review [name] fill history and check back in on BP meds if he hasn't contacted us  for refills. Documentation going forwards will be in [employer].   Flint Hummer, PharmD

## 2024-01-25 DIAGNOSIS — E782 Mixed hyperlipidemia: Secondary | ICD-10-CM | POA: Diagnosis not present

## 2024-01-25 DIAGNOSIS — E1169 Type 2 diabetes mellitus with other specified complication: Secondary | ICD-10-CM | POA: Diagnosis not present

## 2024-02-03 DIAGNOSIS — D696 Thrombocytopenia, unspecified: Secondary | ICD-10-CM | POA: Diagnosis not present

## 2024-02-03 DIAGNOSIS — M1711 Unilateral primary osteoarthritis, right knee: Secondary | ICD-10-CM | POA: Diagnosis not present

## 2024-02-03 DIAGNOSIS — G4700 Insomnia, unspecified: Secondary | ICD-10-CM | POA: Diagnosis not present

## 2024-02-03 DIAGNOSIS — E1169 Type 2 diabetes mellitus with other specified complication: Secondary | ICD-10-CM | POA: Diagnosis not present

## 2024-02-03 DIAGNOSIS — M25572 Pain in left ankle and joints of left foot: Secondary | ICD-10-CM | POA: Diagnosis not present

## 2024-02-03 DIAGNOSIS — E782 Mixed hyperlipidemia: Secondary | ICD-10-CM | POA: Diagnosis not present

## 2024-02-03 DIAGNOSIS — R809 Proteinuria, unspecified: Secondary | ICD-10-CM | POA: Diagnosis not present

## 2024-02-03 DIAGNOSIS — I10 Essential (primary) hypertension: Secondary | ICD-10-CM | POA: Diagnosis not present

## 2024-02-03 DIAGNOSIS — R944 Abnormal results of kidney function studies: Secondary | ICD-10-CM | POA: Diagnosis not present

## 2024-02-20 ENCOUNTER — Telehealth: Payer: Self-pay

## 2024-02-20 NOTE — Telephone Encounter (Signed)
 Overdue on Farxiga, refills sent to pharmacy

## 2024-04-10 ENCOUNTER — Telehealth: Payer: Self-pay

## 2024-04-10 NOTE — Telephone Encounter (Signed)
 Overdue on rosuvastatin, unsuccessful outreach, will try again next week

## 2024-05-30 DIAGNOSIS — Z125 Encounter for screening for malignant neoplasm of prostate: Secondary | ICD-10-CM | POA: Diagnosis not present

## 2024-05-30 DIAGNOSIS — E782 Mixed hyperlipidemia: Secondary | ICD-10-CM | POA: Diagnosis not present

## 2024-05-30 DIAGNOSIS — E1169 Type 2 diabetes mellitus with other specified complication: Secondary | ICD-10-CM | POA: Diagnosis not present

## 2024-06-06 DIAGNOSIS — M25572 Pain in left ankle and joints of left foot: Secondary | ICD-10-CM | POA: Diagnosis not present

## 2024-06-06 DIAGNOSIS — D696 Thrombocytopenia, unspecified: Secondary | ICD-10-CM | POA: Diagnosis not present

## 2024-06-06 DIAGNOSIS — Z0001 Encounter for general adult medical examination with abnormal findings: Secondary | ICD-10-CM | POA: Diagnosis not present

## 2024-06-06 DIAGNOSIS — R809 Proteinuria, unspecified: Secondary | ICD-10-CM | POA: Diagnosis not present

## 2024-06-06 DIAGNOSIS — M1711 Unilateral primary osteoarthritis, right knee: Secondary | ICD-10-CM | POA: Diagnosis not present

## 2024-06-06 DIAGNOSIS — I10 Essential (primary) hypertension: Secondary | ICD-10-CM | POA: Diagnosis not present

## 2024-06-06 DIAGNOSIS — E782 Mixed hyperlipidemia: Secondary | ICD-10-CM | POA: Diagnosis not present

## 2024-06-06 DIAGNOSIS — R944 Abnormal results of kidney function studies: Secondary | ICD-10-CM | POA: Diagnosis not present

## 2024-06-06 DIAGNOSIS — G4700 Insomnia, unspecified: Secondary | ICD-10-CM | POA: Diagnosis not present

## 2024-06-06 DIAGNOSIS — Z23 Encounter for immunization: Secondary | ICD-10-CM | POA: Diagnosis not present

## 2024-06-06 DIAGNOSIS — R6884 Jaw pain: Secondary | ICD-10-CM | POA: Diagnosis not present

## 2024-06-18 DIAGNOSIS — Z1211 Encounter for screening for malignant neoplasm of colon: Secondary | ICD-10-CM | POA: Diagnosis not present

## 2024-06-24 LAB — COLOGUARD: COLOGUARD: NEGATIVE
